# Patient Record
Sex: Female | Born: 1955 | Race: White | Hispanic: No
Health system: Southern US, Community
[De-identification: ages and names within clinical notes are randomized; demographics above are authoritative.]

## PROBLEM LIST (undated history)

## (undated) DIAGNOSIS — E119 Type 2 diabetes mellitus without complications: Secondary | ICD-10-CM

## (undated) DIAGNOSIS — F419 Anxiety disorder, unspecified: Secondary | ICD-10-CM

## (undated) DIAGNOSIS — Z720 Tobacco use: Secondary | ICD-10-CM

## (undated) DIAGNOSIS — I10 Essential (primary) hypertension: Secondary | ICD-10-CM

## (undated) DIAGNOSIS — E785 Hyperlipidemia, unspecified: Secondary | ICD-10-CM

## (undated) HISTORY — PX: OTHER SURGICAL HISTORY: SHX169

---

## 2007-02-08 ENCOUNTER — Emergency Department: Payer: Self-pay | Admitting: Emergency Medicine

## 2009-10-21 ENCOUNTER — Emergency Department: Payer: Self-pay | Admitting: Emergency Medicine

## 2009-12-25 ENCOUNTER — Inpatient Hospital Stay: Payer: Self-pay | Admitting: Internal Medicine

## 2010-02-13 ENCOUNTER — Emergency Department: Payer: Self-pay | Admitting: Emergency Medicine

## 2010-02-15 ENCOUNTER — Emergency Department: Payer: Self-pay | Admitting: Emergency Medicine

## 2019-06-11 ENCOUNTER — Emergency Department: Payer: Medicaid Other

## 2019-06-11 ENCOUNTER — Other Ambulatory Visit: Payer: Self-pay

## 2019-06-11 ENCOUNTER — Observation Stay
Admission: EM | Admit: 2019-06-11 | Discharge: 2019-06-13 | Disposition: A | Discharge status: Home / Self Care | Payer: Medicaid Other | Attending: Internal Medicine | Admitting: Internal Medicine

## 2019-06-11 ENCOUNTER — Encounter: Payer: Self-pay | Admitting: Emergency Medicine

## 2019-06-11 DIAGNOSIS — I2510 Atherosclerotic heart disease of native coronary artery without angina pectoris: Secondary | ICD-10-CM | POA: Diagnosis not present

## 2019-06-11 DIAGNOSIS — M1712 Unilateral primary osteoarthritis, left knee: Secondary | ICD-10-CM | POA: Diagnosis not present

## 2019-06-11 DIAGNOSIS — Z7902 Long term (current) use of antithrombotics/antiplatelets: Secondary | ICD-10-CM | POA: Insufficient documentation

## 2019-06-11 DIAGNOSIS — E1165 Type 2 diabetes mellitus with hyperglycemia: Secondary | ICD-10-CM | POA: Diagnosis not present

## 2019-06-11 DIAGNOSIS — Z79899 Other long term (current) drug therapy: Secondary | ICD-10-CM | POA: Insufficient documentation

## 2019-06-11 DIAGNOSIS — F1320 Sedative, hypnotic or anxiolytic dependence, uncomplicated: Secondary | ICD-10-CM | POA: Insufficient documentation

## 2019-06-11 DIAGNOSIS — E785 Hyperlipidemia, unspecified: Secondary | ICD-10-CM | POA: Diagnosis not present

## 2019-06-11 DIAGNOSIS — E559 Vitamin D deficiency, unspecified: Secondary | ICD-10-CM | POA: Diagnosis not present

## 2019-06-11 DIAGNOSIS — Z888 Allergy status to other drugs, medicaments and biological substances status: Secondary | ICD-10-CM | POA: Insufficient documentation

## 2019-06-11 DIAGNOSIS — F17210 Nicotine dependence, cigarettes, uncomplicated: Secondary | ICD-10-CM | POA: Insufficient documentation

## 2019-06-11 DIAGNOSIS — I10 Essential (primary) hypertension: Secondary | ICD-10-CM | POA: Insufficient documentation

## 2019-06-11 DIAGNOSIS — Z880 Allergy status to penicillin: Secondary | ICD-10-CM | POA: Diagnosis not present

## 2019-06-11 DIAGNOSIS — D649 Anemia, unspecified: Secondary | ICD-10-CM | POA: Diagnosis not present

## 2019-06-11 DIAGNOSIS — R262 Difficulty in walking, not elsewhere classified: Secondary | ICD-10-CM | POA: Diagnosis present

## 2019-06-11 DIAGNOSIS — Z8249 Family history of ischemic heart disease and other diseases of the circulatory system: Secondary | ICD-10-CM | POA: Insufficient documentation

## 2019-06-11 DIAGNOSIS — W19XXXA Unspecified fall, initial encounter: Secondary | ICD-10-CM | POA: Diagnosis not present

## 2019-06-11 DIAGNOSIS — Z794 Long term (current) use of insulin: Secondary | ICD-10-CM | POA: Diagnosis not present

## 2019-06-11 DIAGNOSIS — R531 Weakness: Secondary | ICD-10-CM | POA: Diagnosis present

## 2019-06-11 DIAGNOSIS — D509 Iron deficiency anemia, unspecified: Secondary | ICD-10-CM | POA: Diagnosis not present

## 2019-06-11 DIAGNOSIS — F419 Anxiety disorder, unspecified: Secondary | ICD-10-CM | POA: Diagnosis not present

## 2019-06-11 DIAGNOSIS — Z20822 Contact with and (suspected) exposure to covid-19: Secondary | ICD-10-CM | POA: Diagnosis not present

## 2019-06-11 DIAGNOSIS — N179 Acute kidney failure, unspecified: Secondary | ICD-10-CM | POA: Insufficient documentation

## 2019-06-11 HISTORY — DX: Tobacco use: Z72.0

## 2019-06-11 HISTORY — DX: Anxiety disorder, unspecified: F41.9

## 2019-06-11 HISTORY — DX: Hyperlipidemia, unspecified: E78.5

## 2019-06-11 HISTORY — DX: Essential (primary) hypertension: I10

## 2019-06-11 HISTORY — DX: Type 2 diabetes mellitus without complications: E11.9

## 2019-06-11 NOTE — ED Triage Notes (Signed)
Pt presents to ED via [facility] from home c/o feeling like her L knee if giving out on her starting today. Pt denies any pain or injury but states every time she tries to put weight on L leg the knee just gives out. Has chronic R knee pain, states she is supposed to have knee replacement on R side but has been delayed d/t pandemic.

## 2019-06-12 ENCOUNTER — Encounter: Payer: Self-pay | Admitting: Family Medicine

## 2019-06-12 DIAGNOSIS — F419 Anxiety disorder, unspecified: Secondary | ICD-10-CM

## 2019-06-12 DIAGNOSIS — E1165 Type 2 diabetes mellitus with hyperglycemia: Secondary | ICD-10-CM | POA: Diagnosis not present

## 2019-06-12 DIAGNOSIS — I10 Essential (primary) hypertension: Secondary | ICD-10-CM

## 2019-06-12 DIAGNOSIS — D649 Anemia, unspecified: Secondary | ICD-10-CM | POA: Diagnosis not present

## 2019-06-12 DIAGNOSIS — R262 Difficulty in walking, not elsewhere classified: Secondary | ICD-10-CM | POA: Diagnosis not present

## 2019-06-12 LAB — BASIC METABOLIC PANEL
Anion gap: 12 (ref 5–15)
Anion gap: 10 (ref 5–15)
BUN: 17 mg/dL (ref 8–23)
BUN: 15 mg/dL (ref 8–23)
CO2: 26 mmol/L (ref 22–32)
CO2: 28 mmol/L (ref 22–32)
Calcium: 8.9 mg/dL (ref 8.9–10.3)
Calcium: 8.8 mg/dL — ABNORMAL LOW (ref 8.9–10.3)
Chloride: 88 mmol/L — ABNORMAL LOW (ref 98–111)
Chloride: 93 mmol/L — ABNORMAL LOW (ref 98–111)
Creatinine, Ser: 1.13 mg/dL — ABNORMAL HIGH (ref 0.44–1.00)
Creatinine, Ser: 0.83 mg/dL (ref 0.44–1.00)
GFR calc Af Amer: >60 mL/min (ref >60)
GFR calc Af Amer: >60 mL/min (ref >60)
GFR calc non Af Amer: 52 mL/min — ABNORMAL LOW (ref >60)
GFR calc non Af Amer: >60 mL/min (ref >60)
Glucose, Bld: 579 mg/dL (ref 70–99)
Glucose, Bld: 317 mg/dL — ABNORMAL HIGH (ref 70–99)
Potassium: 5.1 mmol/L (ref 3.5–5.1)
Potassium: 4 mmol/L (ref 3.5–5.1)
Sodium: 126 mmol/L — ABNORMAL LOW (ref 135–145)
Sodium: 131 mmol/L — ABNORMAL LOW (ref 135–145)

## 2019-06-12 LAB — IRON AND TIBC
Iron: 12 ug/dL — ABNORMAL LOW (ref 28–170)
Saturation Ratios: 3 % — ABNORMAL LOW (ref 10.4–31.8)
TIBC: 434 ug/dL (ref 250–450)
UIBC: 422 ug/dL

## 2019-06-12 LAB — URINALYSIS, COMPLETE (UACMP) WITH MICROSCOPIC
Bacteria, UA: NONE SEEN
Bilirubin Urine: NEGATIVE
Glucose, UA: >=500 mg/dL — AB
Hgb urine dipstick: NEGATIVE
Ketones, ur: NEGATIVE mg/dL
Leukocytes,Ua: NEGATIVE
Nitrite: NEGATIVE
Protein, ur: NEGATIVE mg/dL
Specific Gravity, Urine: 1.025 (ref 1.005–1.030)
pH: 5 (ref 5.0–8.0)

## 2019-06-12 LAB — CBC
HCT: 27.1 % — ABNORMAL LOW (ref 36.0–46.0)
Hemoglobin: 8 g/dL — ABNORMAL LOW (ref 12.0–15.0)
MCH: 22.9 pg — ABNORMAL LOW (ref 26.0–34.0)
MCHC: 29.5 g/dL — ABNORMAL LOW (ref 30.0–36.0)
MCV: 77.7 fL — ABNORMAL LOW (ref 80.0–100.0)
Platelets: 344 10*3/uL (ref 150–400)
RBC: 3.49 MIL/uL — ABNORMAL LOW (ref 3.87–5.11)
RDW: 17.2 % — ABNORMAL HIGH (ref 11.5–15.5)
WBC: 9.8 10*3/uL (ref 4.0–10.5)
nRBC: 0 % (ref 0.0–0.2)

## 2019-06-12 LAB — HIV ANTIBODY (ROUTINE TESTING W REFLEX): HIV Screen 4th Generation wRfx: NONREACTIVE

## 2019-06-12 LAB — CBC WITH DIFFERENTIAL/PLATELET
Abs Immature Granulocytes: 0.03 10*3/uL (ref 0.00–0.07)
Basophils Absolute: 0 10*3/uL (ref 0.0–0.1)
Basophils Relative: 0 %
Eosinophils Absolute: 0.1 10*3/uL (ref 0.0–0.5)
Eosinophils Relative: 1 %
HCT: 29 % — ABNORMAL LOW (ref 36.0–46.0)
Hemoglobin: 8.5 g/dL — ABNORMAL LOW (ref 12.0–15.0)
Immature Granulocytes: 0 %
Lymphocytes Relative: 37 %
Lymphs Abs: 3.4 10*3/uL (ref 0.7–4.0)
MCH: 22.9 pg — ABNORMAL LOW (ref 26.0–34.0)
MCHC: 29.3 g/dL — ABNORMAL LOW (ref 30.0–36.0)
MCV: 78.2 fL — ABNORMAL LOW (ref 80.0–100.0)
Monocytes Absolute: 0.7 10*3/uL (ref 0.1–1.0)
Monocytes Relative: 7 %
Neutro Abs: 5 10*3/uL (ref 1.7–7.7)
Neutrophils Relative %: 55 %
Platelets: 366 10*3/uL (ref 150–400)
RBC: 3.71 MIL/uL — ABNORMAL LOW (ref 3.87–5.11)
RDW: 17.2 % — ABNORMAL HIGH (ref 11.5–15.5)
WBC: 9.3 10*3/uL (ref 4.0–10.5)
nRBC: 0 % (ref 0.0–0.2)

## 2019-06-12 LAB — RESPIRATORY PANEL BY RT PCR (FLU A&B, COVID)
Influenza A by PCR: NEGATIVE
Influenza B by PCR: NEGATIVE
SARS Coronavirus 2 by RT PCR: NEGATIVE

## 2019-06-12 LAB — RETICULOCYTES
Immature Retic Fract: 38.2 % — ABNORMAL HIGH (ref 2.3–15.9)
RBC.: 3.36 MIL/uL — ABNORMAL LOW (ref 3.87–5.11)
Retic Count, Absolute: 72.9 10*3/uL (ref 19.0–186.0)
Retic Ct Pct: 2.2 % (ref 0.4–3.1)

## 2019-06-12 LAB — TYPE AND SCREEN
ABO/RH(D): A POS
Antibody Screen: NEGATIVE

## 2019-06-12 LAB — FOLATE: Folate: 18.3 ng/mL (ref >5.9)

## 2019-06-12 LAB — GLUCOSE, CAPILLARY
Glucose-Capillary: 211 mg/dL — ABNORMAL HIGH (ref 70–99)
Glucose-Capillary: 191 mg/dL — ABNORMAL HIGH (ref 70–99)
Glucose-Capillary: 250 mg/dL — ABNORMAL HIGH (ref 70–99)

## 2019-06-12 LAB — VITAMIN B12: Vitamin B-12: 293 pg/mL (ref 180–914)

## 2019-06-12 LAB — FERRITIN: Ferritin: 6 ng/mL — ABNORMAL LOW (ref 11–307)

## 2019-06-12 MED ORDER — TRAZODONE HCL 50 MG PO TABS: 25.0000 mg | ORAL_TABLET | Freq: Every evening | ORAL | Status: DC | PRN

## 2019-06-12 MED ORDER — GABAPENTIN 400 MG PO CAPS: 400.0000 mg | ORAL_CAPSULE | Freq: Three times a day (TID) | ORAL | Status: DC

## 2019-06-12 MED ORDER — OXYCODONE-ACETAMINOPHEN 5-325 MG PO TABS
1.0000 | ORAL_TABLET | Freq: Once | ORAL | Status: AC
  Administered 2019-06-12: 1 via ORAL
  Filled 2019-06-12: qty 1

## 2019-06-12 MED ORDER — ACETAMINOPHEN 325 MG PO TABS
650.0000 mg | ORAL_TABLET | Freq: Four times a day (QID) | ORAL | Status: DC | PRN
  Administered 2019-06-12 – 2019-06-13 (×4): 650 mg via ORAL
  Filled 2019-06-12 (×4): qty 2

## 2019-06-12 MED ORDER — INSULIN GLARGINE 100 UNIT/ML ~~LOC~~ SOLN
30.0000 [IU] | Freq: Every day | SUBCUTANEOUS | Status: DC
  Administered 2019-06-12: 30 [IU] via SUBCUTANEOUS
  Filled 2019-06-12 (×3): qty 0.3

## 2019-06-12 MED ORDER — INSULIN ASPART 100 UNIT/ML ~~LOC~~ SOLN: 0.0000 [IU] | Freq: Three times a day (TID) | SUBCUTANEOUS | Status: DC

## 2019-06-12 MED ORDER — SODIUM CHLORIDE 0.9 % IV SOLN: INTRAVENOUS | Status: DC

## 2019-06-12 MED ORDER — INSULIN ASPART 100 UNIT/ML ~~LOC~~ SOLN
4.0000 [IU] | Freq: Three times a day (TID) | SUBCUTANEOUS | Status: DC
  Administered 2019-06-12 – 2019-06-13 (×4): 4 [IU] via SUBCUTANEOUS
  Filled 2019-06-12 (×5): qty 1

## 2019-06-12 MED ORDER — INSULIN ASPART 100 UNIT/ML ~~LOC~~ SOLN
0.0000 [IU] | Freq: Every day | SUBCUTANEOUS | Status: DC
  Administered 2019-06-12: 2 [IU] via SUBCUTANEOUS
  Filled 2019-06-12: qty 1

## 2019-06-12 MED ORDER — ONDANSETRON HCL 4 MG/2ML IJ SOLN: 4.0000 mg | Freq: Four times a day (QID) | INTRAMUSCULAR | Status: DC | PRN

## 2019-06-12 MED ORDER — HYDRALAZINE HCL 50 MG PO TABS: 50.0000 mg | ORAL_TABLET | Freq: Four times a day (QID) | ORAL | Status: DC | PRN

## 2019-06-12 MED ORDER — INSULIN ASPART 100 UNIT/ML ~~LOC~~ SOLN
0.0000 [IU] | SUBCUTANEOUS | Status: DC
  Administered 2019-06-12: 15 [IU] via SUBCUTANEOUS
  Filled 2019-06-12: qty 1

## 2019-06-12 MED ORDER — NICOTINE 14 MG/24HR TD PT24
14.0000 mg | MEDICATED_PATCH | Freq: Every day | TRANSDERMAL | Status: DC
  Administered 2019-06-12: 14 mg via TRANSDERMAL
  Filled 2019-06-12: qty 1

## 2019-06-12 MED ORDER — INSULIN GLARGINE 100 UNIT/ML ~~LOC~~ SOLN
32.0000 [IU] | Freq: Once | SUBCUTANEOUS | Status: AC
  Administered 2019-06-12: 32 [IU] via SUBCUTANEOUS
  Filled 2019-06-12: qty 0.32

## 2019-06-12 MED ORDER — FE FUMARATE-B12-VIT C-FA-IFC PO CAPS
1.0000 | ORAL_CAPSULE | Freq: Two times a day (BID) | ORAL | Status: DC
  Administered 2019-06-13: 1 via ORAL
  Filled 2019-06-12 (×3): qty 1

## 2019-06-12 MED ORDER — GABAPENTIN 400 MG PO CAPS
1200.0000 mg | ORAL_CAPSULE | Freq: Three times a day (TID) | ORAL | Status: DC
  Administered 2019-06-12 – 2019-06-13 (×3): 1200 mg via ORAL
  Filled 2019-06-12 (×3): qty 3

## 2019-06-12 MED ORDER — ONDANSETRON HCL 4 MG PO TABS: 4.0000 mg | ORAL_TABLET | Freq: Four times a day (QID) | ORAL | Status: DC | PRN

## 2019-06-12 MED ORDER — ENOXAPARIN SODIUM 40 MG/0.4ML ~~LOC~~ SOLN
40.0000 mg | SUBCUTANEOUS | Status: DC
  Administered 2019-06-12 – 2019-06-13 (×2): 40 mg via SUBCUTANEOUS
  Filled 2019-06-12 (×2): qty 0.4

## 2019-06-12 MED ORDER — ACETAMINOPHEN 650 MG RE SUPP: 650.0000 mg | Freq: Four times a day (QID) | RECTAL | Status: DC | PRN

## 2019-06-12 MED ORDER — LACTATED RINGERS IV BOLUS
1000.0000 mL | Freq: Once | INTRAVENOUS | Status: AC
  Administered 2019-06-12: 1000 mL via INTRAVENOUS

## 2019-06-12 MED ORDER — SODIUM CHLORIDE 0.9 % IV SOLN
510.0000 mg | Freq: Once | INTRAVENOUS | Status: AC
  Administered 2019-06-12: 510 mg via INTRAVENOUS
  Filled 2019-06-12: qty 17

## 2019-06-12 MED ORDER — INSULIN ASPART 100 UNIT/ML ~~LOC~~ SOLN
10.0000 [IU] | Freq: Once | SUBCUTANEOUS | Status: AC
  Administered 2019-06-12: 10 [IU] via INTRAVENOUS
  Filled 2019-06-12: qty 1

## 2019-06-12 MED ORDER — INSULIN ASPART 100 UNIT/ML ~~LOC~~ SOLN
0.0000 [IU] | Freq: Three times a day (TID) | SUBCUTANEOUS | Status: DC
  Administered 2019-06-12: 4 [IU] via SUBCUTANEOUS
  Administered 2019-06-12: 7 [IU] via SUBCUTANEOUS
  Administered 2019-06-13 (×2): 11 [IU] via SUBCUTANEOUS
  Filled 2019-06-12 (×5): qty 1

## 2019-06-12 NOTE — ED Notes (Signed)
Date and time results received: 06/12/19  0139am  Test: Blood Glucose Critical Value: 579  Name of Provider Notified: Don Perking

## 2019-06-12 NOTE — Progress Notes (Addendum)
No charge progress note.   Leah Powell  is a 64 y.o. Caucasian female with a known history of type diabetes mellitus, hypertension, and anxiety, dyslipidemia and ongoing tobacco abuse, presented to emergency room with acute onset of fall.  She stated that she was lying in bed and get up to close the blinds and go to the bathroom when her legs gave way.  She stated that her knees went out and she fell sitting on her buttock.  No head injury.  She denied loss of balance, weakness or paresthesias.  No chest pain or dyspnea or palpitations.  She had right knee surgery in the past.  No urinary frequency or urgency or dysuria or hematuria or flank pain.  No nausea or vomiting.  Imaging negative for any acute injury or fractures.  Found to have hyperglycemia. Per patient she was not using her insulin regularly.  Recently increased dose causing some blurry vision so she decreased her dose. She was on narcotics for her knee pain, and her PCP is trying to decrease the dose due to falls and generalized weakness.  PT is recommending SNF placement. We will observe and reevaluate tomorrow. Diet her on Lantus 30 units at bedtime with resistant SSI and 4 units mealtime coverage. Patient was anemic-checked anemia panel with iron deficiency. -Give her one-time dose of IV iron. -Start her on iron supplement.

## 2019-06-12 NOTE — ED Provider Notes (Signed)
[facility] Emergency Department Provider Note  ____________________________________________  Time seen: Approximately 2:36 AM  I have reviewed the triage vital signs and the nursing notes.   HISTORY  Chief Complaint No chief complaint on file.   HPI Leah Powell is a 64 y.o. female with a history of diabetes, hyperlipidemia, CAD, chronic anxiety, chronic pain, benzodiazepine dependence, hypertension who presents from home for fall.  Patient reports that she was walking at home today when her legs gave out and she fell.  She was unable to stand up.  She feels very weak.  She feels that both her legs are weak but the left is worse than the right.  She has chronic bilateral knee problems which is usually worse on the right side.  She fell onto her buttock.  She could not get up which prompted her to call 911.  She denies hitting her head.  She is on Plavix for CAD.  She denies back pain.  She is complaining of weakness and pain of bilateral knees.  No headache, no slurred speech, no facial droop, no upper extremity weakness or numbness.  No fever or chills, no dysuria or hematuria, no abdominal pain, chest pain or shortness of breath.  PMH Diabetes Hypertension Hyperlipidemia CAD Anxiety Chronic pain  Allergies Budesonide-formoterol fumarate, Carvedilol, Penicillin g, and Sennosides-docusate sodium  FH COPD Mother     Social History Smoking - yes Alcohol - no Drugs - no   Review of Systems  Constitutional: Negative for fever. Eyes: Negative for visual changes. ENT: Negative for sore throat. Neck: No neck pain  Cardiovascular: Negative for chest pain. Respiratory: Negative for shortness of breath. Gastrointestinal: Negative for abdominal pain, vomiting or diarrhea. Genitourinary: Negative for dysuria. Musculoskeletal: Negative for back pain. + b/l knee pain, LLE weakness Skin: Negative for rash. Neurological: Negative for headaches,  weakness or numbness. Psych: No SI or HI  ____________________________________________   PHYSICAL EXAM:  VITAL SIGNS: ED Triage Vitals  Enc Vitals Group     BP 06/11/19 2301 (!) 124/57     Pulse Rate 06/11/19 2301 (!) 102     Resp 06/11/19 2301 18     Temp 06/11/19 2301 97.9 F (36.6 C)     Temp Source 06/11/19 2301 Oral     SpO2 06/11/19 2301 93 %     Weight 06/11/19 2303 173 lb (78.5 kg)     Height 06/11/19 2303 5\' 3"  (1.6 m)     Head Circumference --      Peak Flow --      Pain Score 06/11/19 2303 0     Pain Loc --      Pain Edu? --      Excl. in [place]? --     Constitutional: Alert and oriented. Well appearing and in no apparent distress. HEENT:      Head: Normocephalic and atraumatic.         Eyes: Conjunctivae are normal. Sclera is non-icteric.       Mouth/Throat: Mucous membranes are moist.       Neck: Supple with no signs of meningismus. Cardiovascular: Tachycardic with regular rhythm. Respiratory: Normal respiratory effort. Lungs are clear to auscultation bilaterally. No wheezes, crackles, or rhonchi.  Gastrointestinal: Soft, non tender, and non distended with positive bowel sounds. No rebound or guarding. Musculoskeletal: Nontender with normal range of motion in all extremities.  No CT no spine tenderness, full painless range of motion of bilateral hips and knees, bones are atraumatic.  No  edema, cyanosis, or erythema of extremities. Neurologic: Normal speech and language. Face is symmetric. Moving all extremities.  Normal strength of upper extremities and right lower extremity.  Patient is able to bend her leg on the left at the knee and hip but unable to lift her foot off the bed.  She reports that her leg feels heavy. Difficulty ambulating requiring assistance Skin: Skin is warm, dry and intact. No rash noted. Psychiatric: Mood and affect are normal. Speech and behavior are normal.  ____________________________________________   LABS (all labs ordered are listed,  but only abnormal results are displayed)  Labs Reviewed  CBC WITH DIFFERENTIAL/PLATELET - Abnormal; Notable for the following components:      Result Value   RBC 3.71 (*)    Hemoglobin 8.5 (*)    HCT 29.0 (*)    MCV 78.2 (*)    MCH 22.9 (*)    MCHC 29.3 (*)    RDW 17.2 (*)    All other components within normal limits  BASIC METABOLIC PANEL - Abnormal; Notable for the following components:   Sodium 126 (*)    Chloride 88 (*)    Glucose, Bld 579 (*)    Creatinine, Ser 1.13 (*)    GFR calc non Af Amer 52 (*)    All other components within normal limits  URINALYSIS, COMPLETE (UACMP) WITH MICROSCOPIC - Abnormal; Notable for the following components:   Color, Urine STRAW (*)    APPearance CLEAR (*)    Glucose, UA >=500 (*)    All other components within normal limits  TYPE AND SCREEN   ____________________________________________  EKG  none  ____________________________________________  RADIOLOGY  I have personally reviewed the images performed during this visit and I agree with the Radiologist's read.   Interpretation by Radiologist:  DG Lumbar Spine Complete  Result Date: 06/11/2019 CLINICAL DATA:  Left leg weakness, pain EXAM: LUMBAR SPINE - COMPLETE 4+ VIEW COMPARISON:  None FINDINGS: Five lumbar type vertebral bodies. Normal lumbar lordosis. No evidence of fracture or dislocation. Vertebral body heights and intervertebral disc spaces are maintained. Visualized bony pelvis appears intact. Vascular calcifications. IMPRESSION: Negative. Electronically Signed   By: Charline Bills M.D.   On: 06/11/2019 23:49   DG Knee Complete 4 Views Left  Result Date: 06/11/2019 CLINICAL DATA:  Left leg weakness EXAM: LEFT KNEE - COMPLETE 4+ VIEW COMPARISON:  None. FINDINGS: No fracture or dislocation is seen. Mild degenerative changes with lateral compartment osteophytosis. The visualized soft tissues are unremarkable. No suprapatellar knee joint effusion. IMPRESSION: Mild degenerative  changes of the lateral compartment. Electronically Signed   By: Charline Bills M.D.   On: 06/11/2019 23:48   DG Hip Unilat W or Wo Pelvis 2-3 Views Left  Result Date: 06/11/2019 CLINICAL DATA:  Left leg weakness, pain EXAM: DG HIP (WITH OR WITHOUT PELVIS) 2-3V LEFT COMPARISON:  None. FINDINGS: No fracture or dislocation is seen. The joint spaces are preserved. Visualized bony pelvis appears intact. IMPRESSION: Negative. Electronically Signed   By: Charline Bills M.D.   On: 06/11/2019 23:49     ____________________________________________   PROCEDURES  Procedure(s) performed:yes .1-3 Lead EKG Interpretation Performed by: Nita Sickle, MD Authorized by: Nita Sickle, MD     Interpretation: normal     ECG rate assessment: normal     Rhythm: sinus rhythm     Ectopy: none     Conduction: normal     Critical Care performed:  None ____________________________________________   INITIAL IMPRESSION / ASSESSMENT AND PLAN /  ED COURSE  64 y.o. female with a history of diabetes, hyperlipidemia, CAD, chronic anxiety, chronic pain, benzodiazepine dependence, hypertension who presents from home for fall after her left lower extremity gave out.  Patient is having weakness of that leg but other than that she is neurologically intact.  After couple of hours in the emergency room, she is now able to lift both of her legs but very weak.  Patient having difficulty ambulating even with assistance as she is very weak.  Nonfocal exam.  No CT and L-spine tenderness.  X-rays of the lumbar spine knee and hip were visualized and interpreted by me as negative which was confirmed by radiology.  Patient denies head trauma.  Labs consistent with hyperglycemia with glucose of 579 but no evidence of DKA with a normal anion gap and bicarb.  Patient also has acute kidney injury and worsening anemia.  She denies melena, hematochezia, hematemesis, or hematuria.  She is on Plavix but her rectal exam here is  negative.  Since patient is unable to ambulate and lives alone she would need to be admitted to the hospitalist.  Patient has received IV fluids, IV short acting insulin, her evening dose of IM Lantus.  Type and screen active.  Patient monitor on telemetry.  Old medical records have been reviewed.  Patient has been discussed with the hospitalist service for admission.     _____________________________________________ Please note:  Patient was evaluated in Emergency Department today for the symptoms described in the history of present illness. Patient was evaluated in the context of the global COVID-19 pandemic, which necessitated consideration that the patient might be at risk for infection with the SARS-CoV-2 virus that causes COVID-19. Institutional protocols and algorithms that pertain to the evaluation of patients at risk for COVID-19 are in a state of rapid change based on information released by regulatory bodies including the CDC and federal and state organizations. These policies and algorithms were followed during the patient's care in the ED.  Some ED evaluations and interventions may be delayed as a result of limited staffing during the pandemic.   [place] Controlled Substance Database was reviewed by me. ____________________________________________   FINAL CLINICAL IMPRESSION(S) / ED DIAGNOSES   Final diagnoses:  Type 2 diabetes mellitus with hyperglycemia, with long-term current use of insulin (HCC)  Generalized weakness  Anemia, unspecified type  AKI (acute kidney injury) (HCC)      NEW MEDICATIONS STARTED DURING THIS VISIT:  ED Discharge Orders    None       Note:  This document was prepared using Dragon voice recognition software and may include unintentional dictation errors.    Don Perking, [place], MD 06/12/19 [phone]

## 2019-06-12 NOTE — Plan of Care (Signed)
  Problem: Health Behavior/Discharge Planning: Goal: Ability to manage health-related needs will improve Outcome: Progressing   Problem: Clinical Measurements: Goal: Ability to maintain clinical measurements within normal limits will improve Outcome: Progressing Goal: Will remain free from infection Outcome: Progressing   Problem: Activity: Goal: Risk for activity intolerance will decrease Outcome: Progressing   Problem: Nutrition: Goal: Adequate nutrition will be maintained Outcome: Progressing   Problem: Coping: Goal: Level of anxiety will decrease Outcome: Progressing   Problem: Elimination: Goal: Will not experience complications related to bowel motility Outcome: Progressing Goal: Will not experience complications related to urinary retention Outcome: Progressing   Problem: Pain Managment: Goal: General experience of comfort will improve Outcome: Progressing   Problem: Safety: Goal: Ability to remain free from injury will improve Outcome: Progressing   Problem: Skin Integrity: Goal: Risk for impaired skin integrity will decrease Outcome: Progressing   

## 2019-06-12 NOTE — ED Notes (Signed)
With pt's permission, son updated on pt status.  Son requesting to talk with MD to get more information.  Will relay this to attending.

## 2019-06-12 NOTE — H&P (Addendum)
[place] at [facility]   PATIENT NAME: Leah Powell    MR#:  [id number]  DATE OF BIRTH:  1955-12-13  DATE OF ADMISSION:  06/11/2019  PRIMARY CARE PHYSICIAN: System, Pcp Not In   REQUESTING/REFERRING PHYSICIAN: Cecil Cobbs, MD CHIEF COMPLAINT:  Fall, could not walk  HISTORY OF PRESENT ILLNESS:  Leah Powell  is a 64 y.o. Caucasian female with a known history of type diabetes mellitus, hypertension, and anxiety, dyslipidemia and ongoing tobacco abuse, presented to emergency room with acute onset of fall.  She stated that she was lying in bed and get up to close the blinds and go to the bathroom when her legs gave way.  She stated that her knees went out and she fell sitting on her buttock.  No head injury.  She denied loss of balance, weakness or paresthesias.  No chest pain or dyspnea or palpitations.  She had right knee surgery in the past.  No urinary frequency or urgency or dysuria or hematuria or flank pain.  No nausea or vomiting.  Upon presentation to the emergency room, heart rate was 105 with otherwise normal vital signs.  Labs revealed hyponatremia and hypochloremia, potassium of 5.1, blood glucose of 579 with a BUN of 17 creatinine 1.13 and CBC showing anemia with hemoglobin 8.5 hematocrit 29 with no previous levels for comparison and low RBC indices.  UA had more than 500 glucose.  Hip x-ray revealed no acute abnormalities.  Knee x-ray revealed mild degenerative changes of the lateral compartment.  LS-spine showed no acute abnormalities.  The patient was given 1 L bolus of IV lactated Ringer, 10 units of IV NovoLog and 32 units of subcutaneous Lantus as well as 1 p.o. Percocet.  She will be admitted to an observation medical monitored bed for further evaluation and management.  PAST MEDICAL HISTORY:   Past Medical History:  Diagnosis Date  . Anxiety   . Dyslipidemia   . Hypertension   . Tobacco abuse   . Type II diabetes mellitus (HCC)   -Vitamin D  deficiency -Coronary artery disease -History of peripheral neuropathy  PAST SURGICAL HISTORY:  Right knee surgery C-section  SOCIAL HISTORY:   Social History   Tobacco Use  . Smoking status: Current Every Day Smoker    Packs/day: 0.50    Types: Cigarettes  Substance Use Topics  . Alcohol use: Not Currently    FAMILY HISTORY:   Family History  Problem Relation Age of Onset  . Coronary artery disease Mother        Status post MI    DRUG ALLERGIES:   Allergies  Allergen Reactions  . Budesonide-Formoterol Fumarate Swelling  . Carvedilol Swelling  . Penicillin G Hives  . Sennosides-Docusate Sodium Rash    REVIEW OF SYSTEMS:   ROS As per history of present illness. All pertinent systems were reviewed above. Constitutional,  HEENT, cardiovascular, respiratory, GI, GU, musculoskeletal, neuro, psychiatric, endocrine,  integumentary and hematologic systems were reviewed and are otherwise  negative/unremarkable except for positive findings mentioned above in the HPI.   MEDICATIONS AT HOME:   Prior to Admission medications   Not on File      VITAL SIGNS:  Blood pressure 107/75, pulse 89, temperature 97.9 F (36.6 C), temperature source Oral, resp. rate 18, height 5\' 3"  (1.6 m), weight 78.5 kg, SpO2 97 %.  PHYSICAL EXAMINATION:  Physical Exam  GENERAL:  64 y.o.-year-old Caucasian female patient lying in the bed with no acute distress.  EYES: Pupils  equal, round, reactive to light and accommodation. No scleral icterus. Extraocular muscles intact.  HEENT: Head atraumatic, normocephalic. Oropharynx and nasopharynx clear.  NECK:  Supple, no jugular venous distention. No thyroid enlargement, no tenderness.  LUNGS: Normal breath sounds bilaterally, no wheezing, rales,rhonchi or crepitation. No use of accessory muscles of respiration.  CARDIOVASCULAR: Regular rate and rhythm, S1, S2 normal. No murmurs, rubs, or gallops.  ABDOMEN: Soft, nondistended, nontender. Bowel  sounds present. No organomegaly or mass.  EXTREMITIES: No pedal edema, cyanosis, or clubbing. Musculoskeletal: Mild pain on range of motion of both knees. NEUROLOGIC: Cranial nerves II through XII are intact. Muscle strength 5/5 in all extremities. Sensation intact. Gait not checked.  PSYCHIATRIC: The patient is alert and oriented x 3.  Normal affect and good eye contact. SKIN: No obvious rash, lesion, or ulcer.   LABORATORY PANEL:   CBC Recent Labs  Lab 06/12/19 0113  WBC 9.3  HGB 8.5*  HCT 29.0*  PLT 366   ------------------------------------------------------------------------------------------------------------------  Chemistries  Recent Labs  Lab 06/12/19 0113  NA 126*  K 5.1  CL 88*  CO2 26  GLUCOSE 579*  BUN 17  CREATININE 1.13*  CALCIUM 8.9   ------------------------------------------------------------------------------------------------------------------  Cardiac Enzymes No results for input(s): TROPONINI in the last 168 hours. ------------------------------------------------------------------------------------------------------------------  RADIOLOGY:  DG Lumbar Spine Complete  Result Date: 06/11/2019 CLINICAL DATA:  Left leg weakness, pain EXAM: LUMBAR SPINE - COMPLETE 4+ VIEW COMPARISON:  None FINDINGS: Five lumbar type vertebral bodies. Normal lumbar lordosis. No evidence of fracture or dislocation. Vertebral body heights and intervertebral disc spaces are maintained. Visualized bony pelvis appears intact. Vascular calcifications. IMPRESSION: Negative. Electronically Signed   By: Julian Hy M.D.   On: 06/11/2019 23:49   DG Knee Complete 4 Views Left  Result Date: 06/11/2019 CLINICAL DATA:  Left leg weakness EXAM: LEFT KNEE - COMPLETE 4+ VIEW COMPARISON:  None. FINDINGS: No fracture or dislocation is seen. Mild degenerative changes with lateral compartment osteophytosis. The visualized soft tissues are unremarkable. No suprapatellar knee joint  effusion. IMPRESSION: Mild degenerative changes of the lateral compartment. Electronically Signed   By: Julian Hy M.D.   On: 06/11/2019 23:48   DG Hip Unilat W or Wo Pelvis 2-3 Views Left  Result Date: 06/11/2019 CLINICAL DATA:  Left leg weakness, pain EXAM: DG HIP (WITH OR WITHOUT PELVIS) 2-3V LEFT COMPARISON:  None. FINDINGS: No fracture or dislocation is seen. The joint spaces are preserved. Visualized bony pelvis appears intact. IMPRESSION: Negative. Electronically Signed   By: Julian Hy M.D.   On: 06/11/2019 23:49      IMPRESSION AND PLAN:  1.  Fall with subsequent inability to ambulate. -The patient will be admitted to an observation medical monitored bed. -Pain management will be provided -Physical therapy consult to be obtained. -We will obtain an orthopedic evaluation given inability to ambulate. -I notified Dr. Mack Guise about the patient.  2.  Uncontrolled type 2 diabetes mellitus with nonketotic hyperglycemia. -The patient will be placed on intense protocol with subcutaneous NovoLog with frequent fingerstick blood glucose measures. -We will continue her basal coverage with Lantus. -We will check her hemoglobin A1c. -Continue hydration with IV normal saline.  3.  Anemia. -We have no previous records to show chronicity. -We will obtain anemia work-up for the possibility of symptomatic anemia of new onset or acute on chronic anemia.  4.  Anxiety. -She is on BuSpar and as needed Valium.  She apparently takes them in the morning and her fall happened at night. -We  will hold them off for now pending physical therapy and orthopedic evaluation.  5.  Hypertension. -We will continue her antihypertensives as appropriate when available from her pharmacy. -For now, she will be placed on as needed hydralazine.  6.  Dyslipidemia. -We will continue her antihyperlipidemic therapy when available.  7.  DVT prophylaxis. -Subcutaneous Lovenox.   All the records are  reviewed and case discussed with ED provider. The plan of care was discussed in details with the patient (and family). I answered all questions. The patient agreed to proceed with the above mentioned plan. Further management will depend upon hospital course.   CODE STATUS: Full code  Status is: Observation  The patient remains OBS appropriate and will d/c before 2 midnights.  Dispo: The patient is from: Home              Anticipated d/c is to: Home              Anticipated d/c date is: 1 day              Patient currently is not medically stable to d/c.   TOTAL TIME TAKING CARE OF THIS PATIENT: 55 minutes.    Hannah Beat M.D on 06/12/2019 at 4:54 AM  [facility]   From 7 PM-7 AM, contact night-coverage [email or web address]  CC: Primary care physician; System, Pcp Not In   Note: This dictation was prepared with Dragon dictation along with smaller phrase technology. Any transcriptional errors that result from this process are unintentional.

## 2019-06-12 NOTE — Consult Note (Signed)
ORTHOPAEDIC CONSULTATION  REQUESTING PHYSICIAN: Arnetha Courser, MD  Chief Complaint: Difficulty weightbearing on left lower extremity status post fall  HPI: Leah Powell is a 64 y.o. female who was admitted from the emergency department overnight.  Patient explains that she slid off her bed while trying to close the blinds of her room.  Patient denies previous issues with her left lower extremity.  She states she is due to have a right total knee replacement at [facility] in the next couple months.  Patient fell onto her buttock was unable to get up.  Patient called 911 was brought to the hospital for evaluation.  Today the patient states her left knee is feeling much better.  She denies any significant pain in the left lower extremity.  She denies any numbness or tingling in the left lower extremity..  Past Medical History:  Diagnosis Date  . Anxiety   . Dyslipidemia   . Hypertension   . Tobacco abuse   . Type II diabetes mellitus (HCC)    Past Surgical History:  Procedure Laterality Date  . CESAREAN SECTION    . Right knee surgery     Social History   Socioeconomic History  . Marital status: Single    Spouse name: Not on file  . Number of children: Not on file  . Years of education: Not on file  . Highest education level: Not on file  Occupational History  . Not on file  Tobacco Use  . Smoking status: Current Every Day Smoker    Packs/day: 0.50    Types: Cigarettes  . Smokeless tobacco: Never Used  Substance and Sexual Activity  . Alcohol use: Not Currently  . Drug use: Never  . Sexual activity: Not on file  Other Topics Concern  . Not on file  Social History Narrative  . Not on file   Social Determinants of Health   Financial Resource Strain:   . Difficulty of Paying Living Expenses:   Food Insecurity:   . Worried About Programme researcher, broadcasting/film/video in the Last Year:   . Barista in the Last Year:   Transportation Needs:   . Freight forwarder (Medical):   Marland Kitchen  Lack of Transportation (Non-Medical):   Physical Activity:   . Days of Exercise per Week:   . Minutes of Exercise per Session:   Stress:   . Feeling of Stress :   Social Connections:   . Frequency of Communication with Friends and Family:   . Frequency of Social Gatherings with Friends and Family:   . Attends Religious Services:   . Active Member of Clubs or Organizations:   . Attends Banker Meetings:   Marland Kitchen Marital Status:    Family History  Problem Relation Age of Onset  . Coronary artery disease Mother        Status post MI   Allergies  Allergen Reactions  . Budesonide-Formoterol Fumarate Swelling  . Carvedilol Swelling  . Penicillin G Hives  . Sennosides-Docusate Sodium Rash   Prior to Admission medications   Medication Sig Start Date End Date Taking? Authorizing Provider  acetaminophen (TYLENOL) 500 MG tablet Take by mouth. Take 2 tablets (1,000 mg total) by mouth every six (6) hours as needed for pain. 04/17/19 04/16/20 Yes [provider]  atorvastatin (LIPITOR) 40 MG tablet Take by mouth. Take 1 tablet (40 mg total) by mouth daily. For cholesterol 02/11/19 02/11/20 Yes [provider]  bisacodyl (DULCOLAX) 10 MG suppository Place 10  mg rectally as needed. 10/28/18  Yes [provider]  busPIRone (BUSPAR) 10 MG tablet Take by mouth. Take 1 tablet (10 mg total) by mouth Four (4) times a day. 03/25/19 03/24/20 Yes [provider]  Cholecalciferol 25 MCG (1000 UT) tablet Take 1,000 Units by mouth daily. 05/02/19 08/18/19 Yes [provider]  clonazePAM (KLONOPIN) 0.5 MG tablet Take 0.25 mg by mouth in the morning. 03/31/17  Yes [provider]  clopidogrel (PLAVIX) 75 MG tablet Take 75 mg by mouth daily. 03/01/19  Yes [provider]  diazepam (VALIUM) 5 MG tablet Take by mouth. Take ONE AND ONE-HALF tablets (7.5 mg total) by mouth every morning AND ONE HALF tablet (2.5 mg total) nightly. 04/29/19 07/03/19 Yes  [provider]  esomeprazole (NEXIUM) 40 MG capsule Take 40 mg by mouth daily. 03/17/19 03/16/20 Yes [provider]  ezetimibe (ZETIA) 10 MG tablet Take 10 mg by mouth daily. 05/03/19 05/02/20 Yes [provider]  gabapentin (NEURONTIN) 400 MG capsule Take by mouth. Take 3 capsules (1,200 mg total) by mouth Three (3) times a day. 05/25/19 05/24/20 Yes [provider]  insulin glargine (LANTUS SOLOSTAR) 100 UNIT/ML Solostar Pen Inject 32 Units into the skin at bedtime. Inject 0.32 mL (32 units total) under the skin nightly 03/31/17  Yes [provider]  lidocaine (XYLOCAINE) 5 % ointment Apply to back of left arm twice a day as needed 10/22/18  Yes [provider]  liraglutide (VICTOZA) 18 MG/3ML SOPN Inject into the skin. Inject 0.3 mL (1.8 mg total) under the skin daily. 03/29/18  Yes [provider]  melatonin 3 MG TABS tablet Take by mouth. Take 3 tablets (9 mg total) by mouth nightly. 03/29/18  Yes [provider]  nicotine (NICODERM CQ - DOSED IN MG/24 HOURS) 21 mg/24hr patch Place onto the skin. Place 1 patch on the skin daily. Place patch on hairless skin on upper body, including arms and back. Each day: discard old patch, shower, apply new patch to different site. 02/17/19  Yes [provider]  nicotine polacrilex (COMMIT) 2 MG lozenge Use 1 lozenge every 1-2 hours as needed instead of a cigarette 04/29/19  Yes [provider]  nitroGLYCERIN (NITROSTAT) 0.4 MG SL tablet Place under the tongue. Place 1 tablet (0.4 mg total) under the tongue every five (5) minutes as needed for chest pain. Maximum of 3 doses in 15 minutes. 10/20/18 10/20/19 Yes [provider]  oxyCODONE (OXY IR/ROXICODONE) 5 MG immediate release tablet Take by mouth.  Take 1 tablet (5 mg total) by mouth every eight (8) hours as needed for pain. 05/19/19 06/18/19 Yes [provider]  valsartan (DIOVAN) 80 MG tablet Take by mouth. Take 1  tablet (80 mg total) by mouth daily. 09/14/18 09/14/19 Yes [provider]  metFORMIN (GLUCOPHAGE) 1000 MG tablet Take by mouth. Take 1,000 mg by mouth 2 (two) times daily with meals    [provider]   DG Lumbar Spine Complete  Result Date: 06/11/2019 CLINICAL DATA:  Left leg weakness, pain EXAM: LUMBAR SPINE - COMPLETE 4+ VIEW COMPARISON:  None FINDINGS: Five lumbar type vertebral bodies. Normal lumbar lordosis. No evidence of fracture or dislocation. Vertebral body heights and intervertebral disc spaces are maintained. Visualized bony pelvis appears intact. Vascular calcifications. IMPRESSION: Negative. Electronically Signed   By: Charline Bills M.D.   On: 06/11/2019 23:49   DG Knee Complete 4 Views Left  Result Date: 06/11/2019 CLINICAL DATA:  Left leg weakness EXAM:  LEFT KNEE - COMPLETE 4+ VIEW COMPARISON:  None. FINDINGS: No fracture or dislocation is seen. Mild degenerative changes with lateral compartment osteophytosis. The visualized soft tissues are unremarkable. No suprapatellar knee joint effusion. IMPRESSION: Mild degenerative changes of the lateral compartment. Electronically Signed   By: Julian Hy M.D.   On: 06/11/2019 23:48   DG Hip Unilat W or Wo Pelvis 2-3 Views Left  Result Date: 06/11/2019 CLINICAL DATA:  Left leg weakness, pain EXAM: DG HIP (WITH OR WITHOUT PELVIS) 2-3V LEFT COMPARISON:  None. FINDINGS: No fracture or dislocation is seen. The joint spaces are preserved. Visualized bony pelvis appears intact. IMPRESSION: Negative. Electronically Signed   By: Julian Hy M.D.   On: 06/11/2019 23:49    Positive ROS: All other systems have been reviewed and were otherwise negative with the exception of those mentioned in the HPI and as above.  Physical Exam: General: Alert, no acute distress  MUSCULOSKELETAL: Left lower extremity: Patient was seen laying in her hospital bed.  Her left knee was flexed 90 degrees when I entered the room.  Patient  is able to actively extend her knee fully.  She can perform a straight leg raise without lag.  She can flex the knee to proximal 120 degrees without pain.  As she had no pain to palpation around the knee.  Her skin is intact.  There is no erythema ecchymosis or effusion.  She had no calf tenderness or lower leg edema.  She palpable pedal pulses, intact/light touch throughout the left lower extremity and intact motor function without detectable weakness.  She had no pain with internal or external rotation of the left hip.  Assessment: Left lower extremity weakness  Plan: Patient states she is feeling much better today.  She had no focal neurologic deficits on her exam today.  Patient had no significant pain with movement or of her left hip knee or ankle.  There is no signs of lower extremity trauma.  Patient states she has been in the process of moving and may have a bulging disc which may have caused some lower extremity weakness.  As she does not demonstrate this weakness this evening.  I am recommending physical therapy evaluation.  Patient does not require any surgical intervention at this time.  There is no evidence of musculoskeletal injuries and I do not feel that advanced imaging is warranted.  If the patient does well with physical therapy tomorrow she may be discharged home and may follow-up in our office on an as-needed basis.Marland Kitchen    Thornton [place], MD    06/12/2019 6:20 PM

## 2019-06-12 NOTE — Progress Notes (Signed)
Physical Therapy Evaluation Patient Details Name: Leah Powell MRN: [id number] DOB: 05/05/1955 Today's Date: 06/12/2019   History of Present Illness  Per MD note:Leah Powell  is a 64 y.o. Caucasian female with a known history of type diabetes mellitus, hypertension, and anxiety, dyslipidemia and ongoing tobacco abuse, presented to emergency room with acute onset of fall.  She stated that she was lying in bed and get up to close the blinds and go to the bathroom when her legs gave way.  She stated that her knees went out and she fell sitting on her buttock.  No head injury.  She denied loss of balance, weakness or paresthesias.  No chest pain or dyspnea or palpitations.  She had right knee surgery in the past.  No urinary frequency or urgency or dysuria or hematuria or flank pain.  No nausea or vomiting.  Clinical Impression  Patient agrees to PT eval. She reports 10/10 to left foot, but is able to move it without increased pain. She has -3/5 hip strength bilaterally and 3/5 knee extension strength bilaterally. She needs mod assist for Supine <> sit bed mobility. She is able to sit with BUE assist and has posterior lean. She is feeling weak and shaking and reports that she has not had anything to eat or drink since last evening at 7:00 pm. Her nurse is able to bring her a breakfast tray and she is set up on stretcher to be able to eat. She is fatigued after sitting up on the edge of the stretcher for 5 mins and is overall weak and trembling. She will benefit from skilled PT to improve mobility and strength.     Follow Up Recommendations SNF    Equipment Recommendations  Rolling walker with 5" wheels    Recommendations for Other Services       Precautions / Restrictions Precautions Precautions: Fall Restrictions Weight Bearing Restrictions: No      Mobility  Bed Mobility Overal bed mobility: Needs Assistance Bed Mobility: Supine to Sit;Sit to Supine     Supine to sit: Mod  assist Sit to supine: Mod assist   General bed mobility comments: needs vc for safety and sequencing  Transfers Overall transfer level: (Patient is shaking due to weakness and thirst)                  Ambulation/Gait Ambulation/Gait assistance: (Pt is feeling weak due to patient needing food and water,)              Stairs            Wheelchair Mobility    Modified Rankin (Stroke Patients Only)       Balance Overall balance assessment: Needs assistance Sitting-balance support: Bilateral upper extremity supported Sitting balance-Leahy Scale: Fair   Postural control: Posterior lean     Standing balance comment: (NT)                             Pertinent Vitals/Pain Pain Assessment: 0-10 Pain Score: 10-Worst pain ever Pain Location: L foot Pain Descriptors / Indicators: Aching Pain Intervention(s): Limited activity within patient's tolerance;Monitored during session    Home Living Family/patient expects to be discharged to:: Private residence Living Arrangements: Alone Available Help at Discharge: Family Type of Home: House Home Access: Stairs to enter Entrance Stairs-Rails: None Entrance Stairs-Number of Steps: 2 Home Layout: One level        Prior Function Level of Independence: Independent  with assistive device(s)               Hand Dominance        Extremity/Trunk Assessment   Upper Extremity Assessment Upper Extremity Assessment: Overall WFL for tasks assessed    Lower Extremity Assessment Lower Extremity Assessment: Generalized weakness;RLE deficits/detail;LLE deficits/detail RLE Deficits / Details: (-3/5 hip flex, 3/5 knee extension) LLE Deficits / Details: (-3/5 hip flex, 3/5 knee extension)       Communication   Communication: No difficulties  Cognition Arousal/Alertness: Awake/alert Behavior During Therapy: WFL for tasks assessed/performed Overall Cognitive Status: Within Functional Limits for tasks  assessed                                 General Comments: she reports that she feels weak,(pt reports nothing to eat or drink today)      General Comments      Exercises     Assessment/Plan    PT Assessment Patient needs continued PT services  PT Problem List Decreased strength;Decreased activity tolerance;Decreased balance;Pain       PT Treatment Interventions Gait training;Functional mobility training;Therapeutic activities;Therapeutic exercise;Balance training;Neuromuscular re-education;Wheelchair mobility training    PT Goals (Current goals can be found in the Care Plan section)  Acute Rehab PT Goals Patient Stated Goal: no goasl stated PT Goal Formulation: Patient unable to participate in goal setting Time For Goal Achievement: 06/26/19 Potential to Achieve Goals: Fair    Frequency Min 2X/week   Barriers to discharge Decreased caregiver support      Co-evaluation               AM-PAC PT "6 Clicks" Mobility  Outcome Measure Help needed turning from your back to your side while in a flat bed without using bedrails?: A Lot Help needed moving from lying on your back to sitting on the side of a flat bed without using bedrails?: A Lot Help needed moving to and from a bed to a chair (including a wheelchair)?: A Lot Help needed standing up from a chair using your arms (e.g., wheelchair or bedside chair)?: A Lot Help needed to walk in hospital room?: A Lot Help needed climbing 3-5 steps with a railing? : A Lot 6 Click Score: 12    End of Session Equipment Utilized During Treatment: Gait belt Activity Tolerance: Patient limited by fatigue;Patient limited by lethargy Patient left: Other (comment)(on stretcher in ER with food tray, nsg notified of rail down)   PT Visit Diagnosis: Unsteadiness on feet (R26.81);Muscle weakness (generalized) (M62.81);History of falling (Z91.81);Difficulty in walking, not elsewhere classified (R26.2)    Time:  9563-8756 PT Time Calculation (min) (ACUTE ONLY): 20 min   Charges:   PT Evaluation $PT Eval Low Complexity: 1 Low PT Treatments $Therapeutic Activity: 8-22 mins          Alanson Puls, PT DPT 06/12/2019, 10:42 AM

## 2019-06-13 DIAGNOSIS — R531 Weakness: Secondary | ICD-10-CM

## 2019-06-13 DIAGNOSIS — Z794 Long term (current) use of insulin: Secondary | ICD-10-CM

## 2019-06-13 DIAGNOSIS — N179 Acute kidney failure, unspecified: Secondary | ICD-10-CM

## 2019-06-13 DIAGNOSIS — E1165 Type 2 diabetes mellitus with hyperglycemia: Secondary | ICD-10-CM

## 2019-06-13 DIAGNOSIS — D649 Anemia, unspecified: Secondary | ICD-10-CM

## 2019-06-13 LAB — GLUCOSE, CAPILLARY
Glucose-Capillary: 269 mg/dL — ABNORMAL HIGH (ref 70–99)
Glucose-Capillary: 282 mg/dL — ABNORMAL HIGH (ref 70–99)
Glucose-Capillary: 294 mg/dL — ABNORMAL HIGH (ref 70–99)

## 2019-06-13 LAB — BASIC METABOLIC PANEL
Anion gap: 5 (ref 5–15)
BUN: 10 mg/dL (ref 8–23)
CO2: 27 mmol/L (ref 22–32)
Calcium: 8.1 mg/dL — ABNORMAL LOW (ref 8.9–10.3)
Chloride: 103 mmol/L (ref 98–111)
Creatinine, Ser: 0.61 mg/dL (ref 0.44–1.00)
GFR calc Af Amer: >60 mL/min (ref >60)
GFR calc non Af Amer: >60 mL/min (ref >60)
Glucose, Bld: 284 mg/dL — ABNORMAL HIGH (ref 70–99)
Potassium: 4.2 mmol/L (ref 3.5–5.1)
Sodium: 135 mmol/L (ref 135–145)

## 2019-06-13 LAB — CBC
HCT: 27.2 % — ABNORMAL LOW (ref 36.0–46.0)
Hemoglobin: 8 g/dL — ABNORMAL LOW (ref 12.0–15.0)
MCH: 22.7 pg — ABNORMAL LOW (ref 26.0–34.0)
MCHC: 29.4 g/dL — ABNORMAL LOW (ref 30.0–36.0)
MCV: 77.3 fL — ABNORMAL LOW (ref 80.0–100.0)
Platelets: 348 10*3/uL (ref 150–400)
RBC: 3.52 MIL/uL — ABNORMAL LOW (ref 3.87–5.11)
RDW: 17.6 % — ABNORMAL HIGH (ref 11.5–15.5)
WBC: 5.7 10*3/uL (ref 4.0–10.5)
nRBC: 0 % (ref 0.0–0.2)

## 2019-06-13 LAB — HEMOGLOBIN A1C
Hgb A1c MFr Bld: 14.9 % — ABNORMAL HIGH (ref 4.8–5.6)
Mean Plasma Glucose: 381 mg/dL

## 2019-06-13 MED ORDER — FE FUMARATE-B12-VIT C-FA-IFC PO CAPS: 1.0000 | ORAL_CAPSULE | Freq: Two times a day (BID) | ORAL | 1 refills | Status: AC

## 2019-06-13 MED ORDER — LANTUS SOLOSTAR 100 UNIT/ML ~~LOC~~ SOPN: 36.0000 [IU] | PEN_INJECTOR | Freq: Every evening | SUBCUTANEOUS | 11 refills | Status: AC

## 2019-06-13 NOTE — Progress Notes (Signed)
Physical Therapy Treatment Patient Details Name: Leah Powell MRN: [id number] DOB: 05-07-1955 Today's Date: 06/13/2019    History of Present Illness Per MD note:Leah Powell  is a 64 y.o. Caucasian female with a known history of type diabetes mellitus, hypertension, and anxiety, dyslipidemia and ongoing tobacco abuse, presented to emergency room with acute onset of fall.  She stated that she was lying in bed and get up to close the blinds and go to the bathroom when her legs gave way.  She stated that her knees went out and she fell sitting on her buttock.  No head injury.  She denied loss of balance, weakness or paresthesias.  No chest pain or dyspnea or palpitations.  She had right knee surgery in the past.  No urinary frequency or urgency or dysuria or hematuria or flank pain.  No nausea or vomiting.  Orthopedic imaging (pelvis, L-spine, L knee negative for acute changes)    PT Comments    Patient with significant improvement in pain control, overall activity tolerance and functional performance this date.  Completing all transfers and gait activities with RW, sup/mod indep.  Good LE strength and stability; no episodes of buckling or LOB noted.  Able to safely negotiate home environment at current level; patient does prefer continued use of RW for optimal safety/stability.  Comfortable with upcoming discharge home; no additional questions/concerns at this time.     Follow Up Recommendations  Outpatient PT     Equipment Recommendations  Rolling walker with 5" wheels    Recommendations for Other Services       Precautions / Restrictions Precautions Precautions: Fall Restrictions Weight Bearing Restrictions: No    Mobility  Bed Mobility Overal bed mobility: Modified Independent                Transfers Overall transfer level: Needs assistance Equipment used: Rolling walker (2 wheeled) Transfers: Sit to/from Stand Sit to Stand: Supervision         General  transfer comment: cuing for hand placement to prevent pulling on RW  Ambulation/Gait Ambulation/Gait assistance: Supervision Gait Distance (Feet): 200 Feet Assistive device: Rolling walker (2 wheeled)   Gait velocity: 10' walk time, 10-11 seconds   General Gait Details: reciprocal stepping pattern with good step height/length, good LE WBing/stability bilat; fair cadence; no buckling, LOB or safety concern. Does prefer continued use of RW for optimal stability.   Stairs             Wheelchair Mobility    Modified Rankin (Stroke Patients Only)       Balance Overall balance assessment: Needs assistance Sitting-balance support: No upper extremity supported;Feet supported Sitting balance-Leahy Scale: Normal     Standing balance support: Bilateral upper extremity supported Standing balance-Leahy Scale: Good                              Cognition Arousal/Alertness: Awake/alert Behavior During Therapy: WFL for tasks assessed/performed Overall Cognitive Status: Within Functional Limits for tasks assessed                                        Exercises Other Exercises Other Exercises: Toilet transfer, ambulatory with RW, sup/mod indep; sit/stand from standard height toilet, sup/mod indep with RW; standing balance for hand hygiene at sink, sup. Good awareness of safety needs and limits of stability. Other Exercises: Reviewed  car transfer technique; patient voiced understanding and agreement. Other Exercises: Adjusted walker to appropriate height for use upon discharge    General Comments        Pertinent Vitals/Pain Pain Assessment: No/denies pain    Home Living                      Prior Function            PT Goals (current goals can now be found in the care plan section) Acute Rehab PT Goals Patient Stated Goal: no goasl stated PT Goal Formulation: Patient unable to participate in goal setting Time For Goal Achievement:  06/26/19 Potential to Achieve Goals: Good Progress towards PT goals: Progressing toward goals    Frequency    Min 2X/week      PT Plan Current plan remains appropriate    Co-evaluation              AM-PAC PT "6 Clicks" Mobility   Outcome Measure  Help needed turning from your back to your side while in a flat bed without using bedrails?: None Help needed moving from lying on your back to sitting on the side of a flat bed without using bedrails?: None Help needed moving to and from a bed to a chair (including a wheelchair)?: None Help needed standing up from a chair using your arms (e.g., wheelchair or bedside chair)?: None Help needed to walk in hospital room?: A Little Help needed climbing 3-5 steps with a railing? : A Little 6 Click Score: 22    End of Session Equipment Utilized During Treatment: Gait belt Activity Tolerance: Patient tolerated treatment well Patient left: in chair;with call bell/phone within reach;with chair alarm set Nurse Communication: Mobility status PT Visit Diagnosis: Unsteadiness on feet (R26.81);Muscle weakness (generalized) (M62.81);History of falling (Z91.81);Difficulty in walking, not elsewhere classified (R26.2)     Time: 1534-1600 PT Time Calculation (min) (ACUTE ONLY): 26 min  Charges:  $Gait Training: 8-22 mins $Therapeutic Activity: 8-22 mins                     Ben Sanz H. Owens Shark, PT, DPT, NCS 06/13/19, 4:14 PM [phone]

## 2019-06-13 NOTE — Discharge Summary (Signed)
Physician Discharge Summary  Leah Powell JOA:[id number] DOB: 1955-04-01 DOA: 06/11/2019  PCP: System, Pcp Not In  Admit date: 06/11/2019 Discharge date: 06/13/2019  Admitted From: Home Disposition:  Home  Recommendations for Outpatient Follow-up:  1. Follow up with PCP in 1-2 weeks 2. Please obtain BMP/CBC in one week 3. Please follow up on the following pending results:None  Home Health:Yes Equipment/Devices: Rolling walker Discharge Condition: Fair CODE STATUS: Full Diet recommendation: Heart Healthy / Carb Modified   Brief/Interim Summary: SherreWilliamsis a63 y.o.Caucasian femalewith a known history of type diabetes mellitus, hypertension, and anxiety, dyslipidemia and ongoing tobacco abuse, presented to emergency room with acute onset of fall. She stated that she was lying in bed and get up to close the blinds and go to the bathroom when her legs gave way. She stated that her knees went out and she fell sitting on her buttock. No head injury. She denied loss of balance, weakness or paresthesias. No chest pain or dyspnea or palpitations. She had right knee surgery in the past. No urinary frequency or urgency or dysuria or hematuria or flank pain. No nausea or vomiting.  Imaging negative for any acute injury or fractures.  Found to have hyperglycemia. Per patient she was not using her insulin regularly.  Recently increased dose causing some blurry vision so she decreased her dose. She was on narcotics for her knee pain, and her PCP is trying to decrease the dose due to falls and generalized weakness.  Patient still on multiple psych medications which can cause weakness and dizziness.  We discontinued Klonopin as patient was on Valium too.  She needs to follow-up with her primary care physician and psychiatrist for further management of her polypharmacy.  PT is recommending SNF placement, wants to go home with home health services as she is in the middle of move and will  follow up with his primary care physician for further recommendations.  Patient has uncontrolled diabetes with A1c of 14.9.  We explained to her that blurry vision was most likely secondary to elevated blood sugar level and start of insulin as her morning blood glucose level never dropped below 140.  She was instructed to continue using 36 units at bedtime along with her other medications and follow-up with primary care physician closely for titration and a tight control of her diabetes.  Patient also found to have iron deficiency anemia along with borderline vitamin B12.  She was given IV iron x1 and discharged on supplement with iron and vitamin B12.  PCP should be able to titrate and monitor.  She will continue rest of her home meds.  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 Please keep yourself well-hydrated. Please follow-up with your primary care physician and work with him to decrease the dose of your pain medications.  You are on multiple medications which can cause weakness and dizziness. If your insurance does not allow home health services then she will given a referral to see an physical therapist as an outpatient.   Increase activity slowly  Complete by: As directed      Allergies as of 06/13/2019      Reactions   Budesonide-formoterol Fumarate Swelling   Carvedilol Swelling   Penicillin G Hives   Sennosides-docusate Sodium Rash      Medication List    STOP taking these medications   clonazePAM 0.5 MG tablet Commonly known as: KLONOPIN     TAKE these  medications   acetaminophen 500 MG tablet Commonly known as: TYLENOL Take by mouth. Take 2 tablets (1,000 mg total) by mouth every six (6) hours as needed for pain.   atorvastatin 40 MG tablet Commonly known as: LIPITOR Take by mouth. Take 1 tablet (40 mg total) by mouth daily. For cholesterol   bisacodyl 10 MG suppository Commonly known as: DULCOLAX Place 10 mg rectally as needed.   busPIRone 10 MG tablet Commonly known as: BUSPAR Take by mouth. Take 1 tablet (10 mg total) by mouth Four (4) times a day.   Cholecalciferol 25 MCG (1000 UT) tablet Take 1,000 Units by mouth daily.   clopidogrel 75 MG tablet Commonly known as: PLAVIX Take 75 mg by mouth daily.   diazepam 5 MG tablet Commonly known as: VALIUM Take by mouth. Take ONE AND ONE-HALF tablets (7.5 mg total) by mouth every morning AND ONE HALF tablet (2.5 mg total) nightly.   esomeprazole 40 MG capsule Commonly known as: NEXIUM Take 40 mg by mouth daily.   ezetimibe 10 MG tablet Commonly known as: ZETIA Take 10 mg by mouth daily.   ferrous [id number] C-folic acid capsule Commonly known as: TRINSICON / FOLTRIN Take 1 capsule by mouth 2 (two) times daily after a meal.   gabapentin 400 MG capsule Commonly known as: NEURONTIN Take by mouth. Take 3 capsules (1,200 mg total) by mouth Three (3) times a day.   Lantus SoloStar 100 UNIT/ML Solostar Pen Generic drug: insulin glargine Inject 36 Units into the skin at bedtime. Inject 0.32 mL (32 units total) under the skin nightly What changed: how much to take   lidocaine 5 % ointment Commonly known as: XYLOCAINE Apply to back of left arm twice a day as needed   liraglutide 18 MG/3ML Sopn Commonly known as: VICTOZA Inject into the skin. Inject 0.3 mL (1.8 mg total) under the skin daily.   melatonin 3 MG Tabs tablet Take by mouth. Take 3 tablets (9 mg total) by mouth nightly.   metFORMIN 1000 MG tablet Commonly known as: GLUCOPHAGE Take by mouth. Take  1,000 mg by mouth 2 (two) times daily with meals   nicotine 21 mg/24hr patch Commonly known as: NICODERM CQ - dosed in mg/24 hours Place onto the skin. Place 1 patch on the skin daily. Place patch on hairless skin on upper body, including arms and back. Each day: discard old patch, shower, apply new patch to different site.   nicotine polacrilex 2 MG lozenge Commonly known as: COMMIT Use 1 lozenge every 1-2 hours as needed instead of a cigarette   nitroGLYCERIN 0.4 MG SL tablet Commonly known as: NITROSTAT Place under the tongue. Place 1 tablet (0.4 mg total) under the tongue every five (5) minutes as needed for chest pain. Maximum of 3 doses in 15 minutes.   oxyCODONE 5 MG immediate release tablet Commonly known as: Oxy IR/ROXICODONE Take by mouth.  Take 1 tablet (5 mg total) by mouth every eight (8) hours as needed for pain.   valsartan 80 MG tablet Commonly known as: DIOVAN Take by mouth. Take 1 tablet (80  mg total) by mouth daily.            Durable Medical Equipment  (From admission, onward)         Start     Ordered   06/13/19 1248  For home use only DME Walker rolling  Once    Question Answer Comment  Walker: With 5 Inch Wheels   Patient needs a walker to treat with the following condition Frequent falls      06/13/19 1247          Allergies  Allergen Reactions  . Budesonide-Formoterol Fumarate Swelling  . Carvedilol Swelling  . Penicillin G Hives  . Sennosides-Docusate Sodium Rash    Consultations:  Orthopedic.  Procedures/Studies: DG Lumbar Spine Complete  Result Date: 06/11/2019 CLINICAL DATA:  Left leg weakness, pain EXAM: LUMBAR SPINE - COMPLETE 4+ VIEW COMPARISON:  None FINDINGS: Five lumbar type vertebral bodies. Normal lumbar lordosis. No evidence of fracture or dislocation. Vertebral body heights and intervertebral disc spaces are maintained. Visualized bony pelvis appears intact. Vascular calcifications. IMPRESSION: Negative. Electronically  Signed   By: Charline Bills M.D.   On: 06/11/2019 23:49   DG Knee Complete 4 Views Left  Result Date: 06/11/2019 CLINICAL DATA:  Left leg weakness EXAM: LEFT KNEE - COMPLETE 4+ VIEW COMPARISON:  None. FINDINGS: No fracture or dislocation is seen. Mild degenerative changes with lateral compartment osteophytosis. The visualized soft tissues are unremarkable. No suprapatellar knee joint effusion. IMPRESSION: Mild degenerative changes of the lateral compartment. Electronically Signed   By: Charline Bills M.D.   On: 06/11/2019 23:48   DG Hip Unilat W or Wo Pelvis 2-3 Views Left  Result Date: 06/11/2019 CLINICAL DATA:  Left leg weakness, pain EXAM: DG HIP (WITH OR WITHOUT PELVIS) 2-3V LEFT COMPARISON:  None. FINDINGS: No fracture or dislocation is seen. The joint spaces are preserved. Visualized bony pelvis appears intact. IMPRESSION: Negative. Electronically Signed   By: Charline Bills M.D.   On: 06/11/2019 23:49     Subjective: Patient has no new complaints today.  She wants to go back home as she was in the middle of move.  Discharge Exam: Vitals:   06/13/19 0013 06/13/19 0817  BP: (!) 116/52 124/73  Pulse: 84 78  Resp: 18 16  Temp: 98.3 F (36.8 C) 97.9 F (36.6 C)  SpO2: 93% 96%   Vitals:   06/12/19 1150 06/12/19 1624 06/13/19 0013 06/13/19 0817  BP:  119/67 (!) 116/52 124/73  Pulse:  89 84 78  Resp:  Temp:  98.3 F (36.8 C) 98.3 F (36.8 C) 97.9 F (36.6 C)  TempSrc:  Oral Oral   SpO2:  94% 93% 96%  Weight:      Height:  (1.6 m)       General: Pt is alert, awake, not in acute distress Cardiovascular: RRR, S1/S2 +, no rubs, no gallops Respiratory: CTA bilaterally, no wheezing, no rhonchi Abdominal: Soft, NT, ND, bowel sounds + Extremities: no edema, no cyanosis   The results of significant diagnostics from this hospitalization (including imaging, microbiology, ancillary and laboratory) are listed below for reference.    Microbiology: Recent  Results (from the past 240 hour(s))  Respiratory Panel by RT PCR (Flu A&B, Covid) - Nasopharyngeal Swab     Status: None   Collection Time: 06/12/19  4:51 AM   Specimen: Nasopharyngeal Swab  Result Value Ref Range Status   SARS Coronavirus 2 by RT PCR NEGATIVE NEGATIVE Final  Comment: (NOTE) SARS-CoV-2 target nucleic acids are NOT DETECTED. The SARS-CoV-2 RNA is generally detectable in upper respiratoy specimens during the acute phase of infection. The lowest concentration of SARS-CoV-2 viral copies this assay can detect is 131 copies/mL. A negative result does not preclude SARS-Cov-2 infection and should not be used as the sole basis for treatment or other patient management decisions. A negative result may occur with  improper specimen collection/handling, submission of specimen other than nasopharyngeal swab, presence of viral mutation(s) within the areas targeted by this assay, and inadequate number of viral copies (<131 copies/mL). A negative result must be combined with clinical observations, patient history, and epidemiological information. The expected result is Negative. Fact Sheet for Patients:  https://www.moore.com/https://www.fda.gov/media/142436/download Fact Sheet for Healthcare Providers:  https://www.young.biz/https://www.fda.gov/media/142435/download This test is not yet ap proved or cleared by the [place] FDA and  has been authorized for detection and/or diagnosis of SARS-CoV-2 by FDA under an Emergency Use Authorization (EUA). This EUA will remain  in effect (meaning this test can be used) for the duration of the COVID-19 declaration under Section 564(b)(1) of the Act, 21 U.S.C. section 360bbb-3(b)(1), unless the authorization is terminated or revoked sooner.    Influenza A by PCR NEGATIVE NEGATIVE Final   Influenza B by PCR NEGATIVE NEGATIVE Final    Comment: (NOTE) The Xpert Xpress SARS-CoV-2/FLU/RSV assay is intended as an aid in  the diagnosis of influenza from Nasopharyngeal swab specimens  and  should not be used as a sole basis for treatment. Nasal washings and  aspirates are unacceptable for Xpert Xpress SARS-CoV-2/FLU/RSV  testing. Fact Sheet for Patients: https://www.moore.com/https://www.fda.gov/media/142436/download Fact Sheet for Healthcare Providers: https://www.young.biz/https://www.fda.gov/media/142435/download This test is not yet approved or cleared by the [place] FDA and  has been authorized for detection and/or diagnosis of SARS-CoV-2 by  FDA under an Emergency Use Authorization (EUA). This EUA will remain  in effect (meaning this test can be used) for the duration of the  Covid-19 declaration under Section 564(b)(1) of the Act, 21  U.S.C. section 360bbb-3(b)(1), unless the authorization is  terminated or revoked. Performed at [facility], [address] [place], [place] [address]      Labs: BNP (last 3 results) No results for input(s): BNP in the last 8760 hours. Basic Metabolic Panel: Recent Labs  Lab 06/12/19 0113 06/12/19 0452 06/13/19 0608  NA 126* 131* 135  K 5.1 4.0 4.2  CL 88* 93* 103  CO2 26 28 27   GLUCOSE 579* 317* 284*  BUN 17 15 10   CREATININE 1.13* 0.83 0.61  CALCIUM 8.9 8.8* 8.1*   Liver Function Tests: No results for input(s): AST, ALT, ALKPHOS, BILITOT, PROT, ALBUMIN in the last 168 hours. No results for input(s): LIPASE, AMYLASE in the last 168 hours. No results for input(s): AMMONIA in the last 168 hours. CBC: Recent Labs  Lab 06/12/19 0113 06/12/19 0452 06/13/19 0608  WBC 9.3 9.8 5.7  NEUTROABS 5.0  --   --   HGB 8.5* 8.0* 8.0*  HCT 29.0* 27.1* 27.2*  MCV 78.2* 77.7* 77.3*  PLT 366 344 348   Cardiac Enzymes: No results for input(s): CKTOTAL, CKMB, CKMBINDEX, TROPONINI in the last 168 hours. BNP: Invalid input(s): POCBNP CBG: Recent Labs  Lab 06/12/19 1205 06/12/19 1648 06/12/19 2048 06/13/19 0812 06/13/19 1219  GLUCAP 211* 191* 250* 269* 282*   D-Dimer No results for input(s): DDIMER in the last 72 hours. Hgb A1c Recent  Labs    06/12/19 0452  HGBA1C 14.9*   Lipid Profile  No results for input(s): CHOL, HDL, LDLCALC, TRIG, CHOLHDL, LDLDIRECT in the last 72 hours. Thyroid function studies No results for input(s): TSH, T4TOTAL, T3FREE, THYROIDAB in the last 72 hours.  Invalid input(s): FREET3 Anemia work up Recent Labs    06/12/19 1237  VITAMINB12 293  FOLATE 18.3  FERRITIN 6*  TIBC 434  IRON 12*  RETICCTPCT 2.2   Urinalysis    Component Value Date/Time   COLORURINE STRAW (A) 06/12/2019 0113   APPEARANCEUR CLEAR (A) 06/12/2019 0113   LABSPEC 1.025 06/12/2019 0113   PHURINE 5.0 06/12/2019 0113   GLUCOSEU >=500 (A) 06/12/2019 0113   HGBUR NEGATIVE 06/12/2019 0113   BILIRUBINUR NEGATIVE 06/12/2019 0113   KETONESUR NEGATIVE 06/12/2019 0113   PROTEINUR NEGATIVE 06/12/2019 0113   NITRITE NEGATIVE 06/12/2019 0113   LEUKOCYTESUR NEGATIVE 06/12/2019 0113   Sepsis Labs Invalid input(s): PROCALCITONIN,  WBC,  LACTICIDVEN Microbiology Recent Results (from the past 240 hour(s))  Respiratory Panel by RT PCR (Flu A&B, Covid) - Nasopharyngeal Swab     Status: None   Collection Time: 06/12/19  4:51 AM   Specimen: Nasopharyngeal Swab  Result Value Ref Range Status   SARS Coronavirus 2 by RT PCR NEGATIVE NEGATIVE Final    Comment: (NOTE) SARS-CoV-2 target nucleic acids are NOT DETECTED. The SARS-CoV-2 RNA is generally detectable in upper respiratoy specimens during the acute phase of infection. The lowest concentration of SARS-CoV-2 viral copies this assay can detect is 131 copies/mL. A negative result does not preclude SARS-Cov-2 infection and should not be used as the sole basis for treatment or other patient management decisions. A negative result may occur with  improper specimen collection/handling, submission of specimen other than nasopharyngeal swab, presence of viral mutation(s) within the areas targeted by this assay, and inadequate number of viral copies (<131 copies/mL). A negative  result must be combined with clinical observations, patient history, and epidemiological information. The expected result is Negative. Fact Sheet for Patients:  https://www.moore.com/ Fact Sheet for Healthcare Providers:  https://www.young.biz/ This test is not yet ap proved or cleared by the [place] FDA and  has been authorized for detection and/or diagnosis of SARS-CoV-2 by FDA under an Emergency Use Authorization (EUA). This EUA will remain  in effect (meaning this test can be used) for the duration of the COVID-19 declaration under Section 564(b)(1) of the Act, 21 U.S.C. section 360bbb-3(b)(1), unless the authorization is terminated or revoked sooner.    Influenza A by PCR NEGATIVE NEGATIVE Final   Influenza B by PCR NEGATIVE NEGATIVE Final    Comment: (NOTE) The Xpert Xpress SARS-CoV-2/FLU/RSV assay is intended as an aid in  the diagnosis of influenza from Nasopharyngeal swab specimens and  should not be used as a sole basis for treatment. Nasal washings and  aspirates are unacceptable for Xpert Xpress SARS-CoV-2/FLU/RSV  testing. Fact Sheet for Patients: https://www.moore.com/ Fact Sheet for Healthcare Providers: https://www.young.biz/ This test is not yet approved or cleared by the [place] FDA and  has been authorized for detection and/or diagnosis of SARS-CoV-2 by  FDA under an Emergency Use Authorization (EUA). This EUA will remain  in effect (meaning this test can be used) for the duration of the  Covid-19 declaration under Section 564(b)(1) of the Act, 21  U.S.C. section 360bbb-3(b)(1), unless the authorization is  terminated or revoked. Performed at [facility], [address] [place], [place] [address]     Time coordinating discharge: Over 30 minutes  SIGNED:  Arnetha Courser, MD  [facility] 06/13/2019, 1:04  PM  If 7PM-7AM, please contact  night-coverage [email or web address]  This record has been created using Conservation officer, historic buildings. Errors have been sought and corrected,but may not always be located. Such creation errors do not reflect on the standard of care.

## 2019-06-13 NOTE — Progress Notes (Signed)
Subjective:  Patient states she is feeling much better today.  She states that she was able to participate well with physical therapy.  She is up out of bed to a chair now.  Objective:   VITALS:   Vitals:   06/12/19 1150 06/12/19 1624 06/13/19 0013 06/13/19 0817  BP:  119/67 (!) 116/52 124/73  Pulse:  89 84 78  Resp:  16 18 16   Temp:  98.3 F (36.8 C) 98.3 F (36.8 C) 97.9 F (36.6 C)  TempSrc:  Oral Oral   SpO2:  94% 93% 96%  Weight:      Height: 5\' 3"  (1.6 m)       PHYSICAL EXAM: Left lower extremity Neurovascular intact Sensation intact distally Intact pulses distally Dorsiflexion/Plantar flexion intact No cellulitis present Compartment soft  LABS  Results for orders placed or performed during the hospital encounter of 06/11/19 (from the past 24 hour(s))  Glucose, capillary     Status: Abnormal   Collection Time: 06/12/19  4:48 PM  Result Value Ref Range   Glucose-Capillary 191 (H) 70 - 99 mg/dL  Glucose, capillary     Status: Abnormal   Collection Time: 06/12/19  8:48 PM  Result Value Ref Range   Glucose-Capillary 250 (H) 70 - 99 mg/dL   Comment 1 Notify RN   CBC     Status: Abnormal   Collection Time: 06/13/19  6:08 AM  Result Value Ref Range   WBC 5.7 4.0 - 10.5 K/uL   RBC 3.52 (L) 3.87 - 5.11 MIL/uL   Hemoglobin 8.0 (L) 12.0 - 15.0 g/dL   HCT 27.2 (L) 36.0 - 46.0 %   MCV 77.3 (L) 80.0 - 100.0 fL   MCH 22.7 (L) 26.0 - 34.0 pg   MCHC 29.4 (L) 30.0 - 36.0 g/dL   RDW 17.6 (H) 11.5 - 15.5 %   Platelets 348 150 - 400 K/uL   nRBC 0.0 0.0 - 0.2 %  Basic metabolic panel     Status: Abnormal   Collection Time: 06/13/19  6:08 AM  Result Value Ref Range   Sodium 135 135 - 145 mmol/L   Potassium 4.2 3.5 - 5.1 mmol/L   Chloride 103 98 - 111 mmol/L   CO2 27 22 - 32 mmol/L   Glucose, Bld 284 (H) 70 - 99 mg/dL   BUN 10 8 - 23 mg/dL   Creatinine, Ser 0.61 0.44 - 1.00 mg/dL   Calcium 8.1 (L) 8.9 - 10.3 mg/dL   GFR calc non Af Amer >60 >60 mL/min   GFR calc Af  Amer >60 >60 mL/min   Anion gap 5 5 - 15  Glucose, capillary     Status: Abnormal   Collection Time: 06/13/19  8:12 AM  Result Value Ref Range   Glucose-Capillary 269 (H) 70 - 99 mg/dL  Glucose, capillary     Status: Abnormal   Collection Time: 06/13/19 12:19 PM  Result Value Ref Range   Glucose-Capillary 282 (H) 70 - 99 mg/dL    DG Lumbar Spine Complete  Result Date: 06/11/2019 CLINICAL DATA:  Left leg weakness, pain EXAM: LUMBAR SPINE - COMPLETE 4+ VIEW COMPARISON:  None FINDINGS: Five lumbar type vertebral bodies. Normal lumbar lordosis. No evidence of fracture or dislocation. Vertebral body heights and intervertebral disc spaces are maintained. Visualized bony pelvis appears intact. Vascular calcifications. IMPRESSION: Negative. Electronically Signed   By: Julian Hy M.D.   On: 06/11/2019 23:49   DG Knee Complete 4 Views Left  Result Date:  06/11/2019 CLINICAL DATA:  Left leg weakness EXAM: LEFT KNEE - COMPLETE 4+ VIEW COMPARISON:  None. FINDINGS: No fracture or dislocation is seen. Mild degenerative changes with lateral compartment osteophytosis. The visualized soft tissues are unremarkable. No suprapatellar knee joint effusion. IMPRESSION: Mild degenerative changes of the lateral compartment. Electronically Signed   By: Charline Bills M.D.   On: 06/11/2019 23:48   DG Hip Unilat W or Wo Pelvis 2-3 Views Left  Result Date: 06/11/2019 CLINICAL DATA:  Left leg weakness, pain EXAM: DG HIP (WITH OR WITHOUT PELVIS) 2-3V LEFT COMPARISON:  None. FINDINGS: No fracture or dislocation is seen. The joint spaces are preserved. Visualized bony pelvis appears intact. IMPRESSION: Negative. Electronically Signed   By: Charline Bills M.D.   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  Patient is in the process of moving.  When she gets settled she will work into formalized physical therapy.  Patient may follow-up in our office on a as needed basis.    Leah Powell , MD 06/13/2019, 4:14 PM

## 2019-06-13 NOTE — TOC Progression Note (Signed)
Transition of Care [facility]) - Progression Note    Patient Details  Name: Leah Powell MRN: [id number] Date of Birth: [birth year]  [facility] CM/SW Contact  Barrie Dunker, RN Phone Number: 06/13/2019, 3:15 PM  Clinical Narrative:    Spoke with the patient about DC needs and plan, she lives alone but is in the process of moving in with her mother, she needs a RW, I contacted Brad with [employer] and a RW was provided for her ot take home, Due to having Medicaid was unable to find a [facility] agency and the patient is not home bound, she is agreeable to go to outpatient PT if she can go once she is settled, I let her know that they would call her to set up with appointment, [facility] the referral to [facility] outpatient PT.  Patient is agreeable and is ready to DC once PT sees her again        Expected Discharge Plan and Services           Expected Discharge Date: 06/13/19                                     Social Determinants of Health (SDOH) Interventions    Readmission Risk Interventions No flowsheet data found.

## 2019-06-13 NOTE — Progress Notes (Signed)
Inpatient Diabetes Program Recommendations  AACE/ADA: New Consensus Statement on Inpatient Glycemic Control   Target Ranges:  Prepandial:   less than 140 mg/dL      Peak postprandial:   less than 180 mg/dL (1-2 hours)      Critically ill patients:  140 - 180 mg/dL  Results for Leah Powell, Leah Powell (MRN [id number]) as of 06/13/2019 08:56  Ref. Range 06/12/2019 12:05 06/12/2019 16:48 06/12/2019 20:48 06/13/2019 08:12  Glucose-Capillary Latest Ref Range: 70 - 99 mg/dL 974 (H) 163 (H) 845 (H) 269 (H)    Review of Glycemic Control  Diabetes history: DM2 Outpatient Diabetes medications: Lantus 32 units QHS, Victoza 1.8 mg daily, Metformin 1000 mg BID Current orders for Inpatient glycemic control: Lantus 30 units QHS, Novolog 0-20 units TID with meals, Novolog 0-5 units QHS, Novolog 4 units TID with meals  Inpatient Diabetes Program Recommendations:   Insulin - Basal: Please consider increasing Lantus to 35 units QHS.  Insulin - Meal Coverage: Please consider increasing meal coverage to Novolog 7 units TID with meals if patient eats at least 50% of meals.  HgbA1C: Per Care Everywhere, patient's last A1C was 14.5% on 04/26/19 (per [facility] Endocirnologist note on 05/04/19).  NOTE: In reviewing chart, noted patient sees [facility] Endocrinology for DM management. Per Care Everywhere, patient seen Dr. Alphia Kava on 05/04/19 and notes last A1C 14.5% on 04/26/19. Per note on 05/04/19, patient was asked to increase Lantus to 36 units QHS and continue Metformin and Victoza as already prescribed. Patient's initial glucose was 579 mg/dl on 3/64/68. Fasting glucose 289 mg/dl today. Spoke with patient over the phone and she reports that she is taking Lantus 32 units QHS, Victoza 1.8 mg daily, and Metformin 1000 mg BID. Inquired about increased dose of Lantus 36 units QHS as recommended by Endocrinology on 05/04/19 and patient states that she started taking the increased dose of Lantus 36 units and she noticed that she starting having issues  with her vision (blurry) so she decreased dose back down to 32 units of Lantus. Patient states that when she took the 36 units her fasting glucose was 150-180's mg/dl and she denies any issues with hypoglycemia. Explained how hyperglycemia leads to damage within blood vessels which lead to the common complications seen with uncontrolled diabetes. Explained that blurry vision would likely be more related to hyperglycemia and not increased dose of Lantus.  Stressed to the patient the importance of improving glycemic control to prevent further complications from uncontrolled diabetes. Discussed impact of nutrition, exercise, stress, sickness, and medications on diabetes control.  Patient states that she has been taking DM medications consistently and she has made several changes with diet to try to get DM under better control. Patient admits that she has been under a significant amount of stress lately which she feels is contributing to hyperglycemia. Reviewed last A1C of 14.5% from 04/26/19 and explained that A1C indicates an average glucose of 369 mg/dl. Discussed A1C and glucose goals. Encouraged patient to take Lantus 36 units daily as prescribed by Endocrinologist (or as directed by MD at time of discharge from hospital), follow up with Endocrinologist, call to schedule eye exam, and check glucose as MD directs.  Patient verbalized understanding of information discussed and reports no further questions at this time related to diabetes.   Thanks, Orlando Penner, RN, MSN, CDE Diabetes Coordinator Inpatient Diabetes Program [phone] (Team Pager from 8am to 5pm)

## 2019-06-13 NOTE — Progress Notes (Signed)
DISCHARGE NOTE:  Pt given discharge instructions by SWOT nurse, Beth. Walker went with pt. Pt wheeled to car by staff.

## 2020-08-20 IMAGING — CR DG LUMBAR SPINE COMPLETE 4+V
1 series · 5 of 5 positions shown · non-contrast
Comparison: None

CLINICAL DATA: Left leg weakness, pain

EXAM:
LUMBAR SPINE - COMPLETE 4+ VIEW

[Series 1: dg lumbar spine complete 4 +v · 0.14mm/px · 5 of 5 slices shown]
[im 1/5]
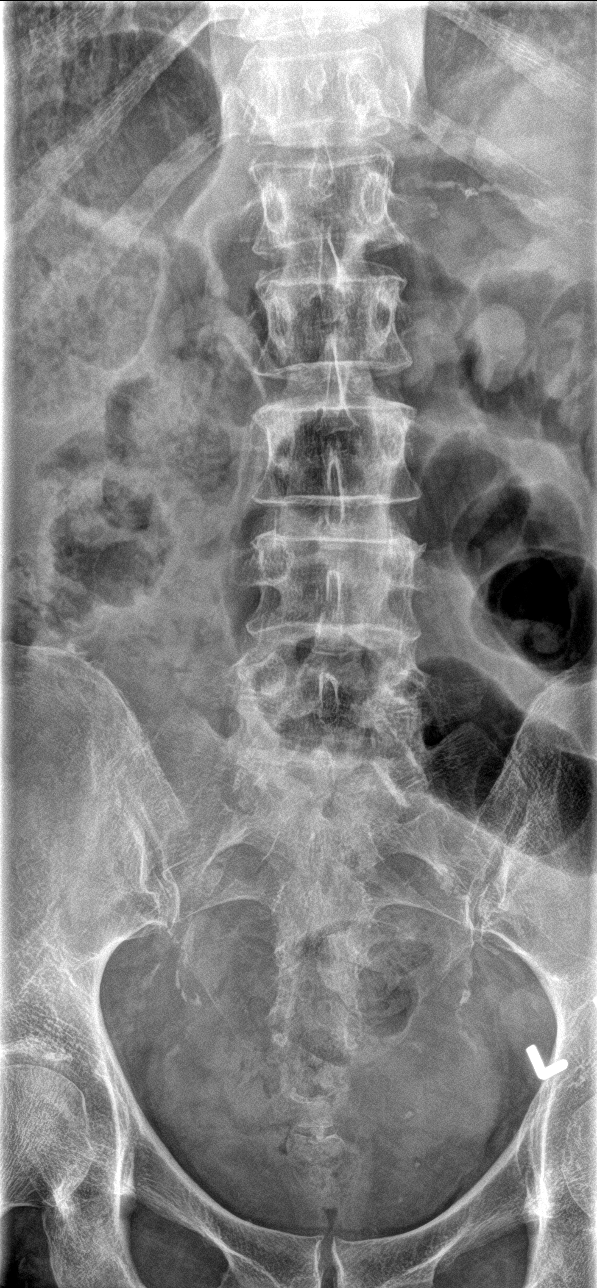
[im 2/5]
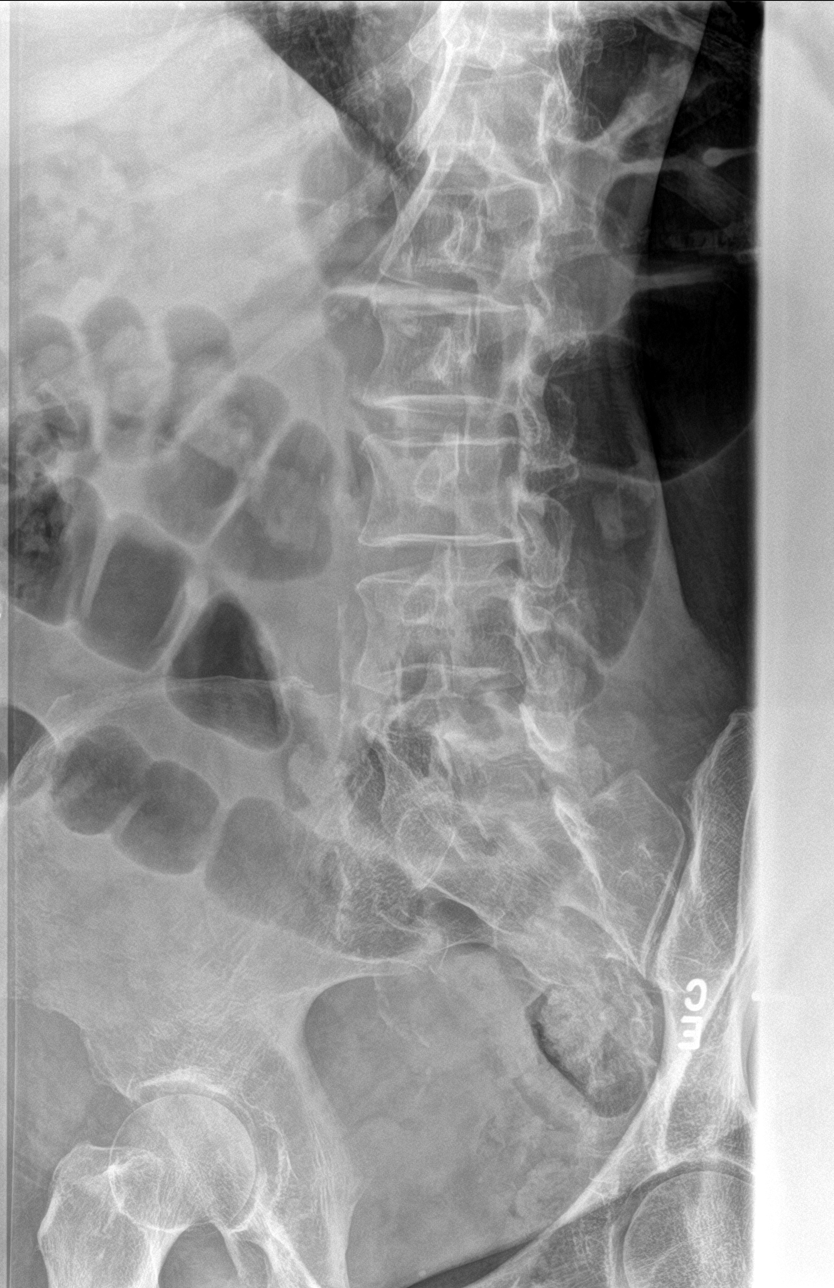
[im 3/5]
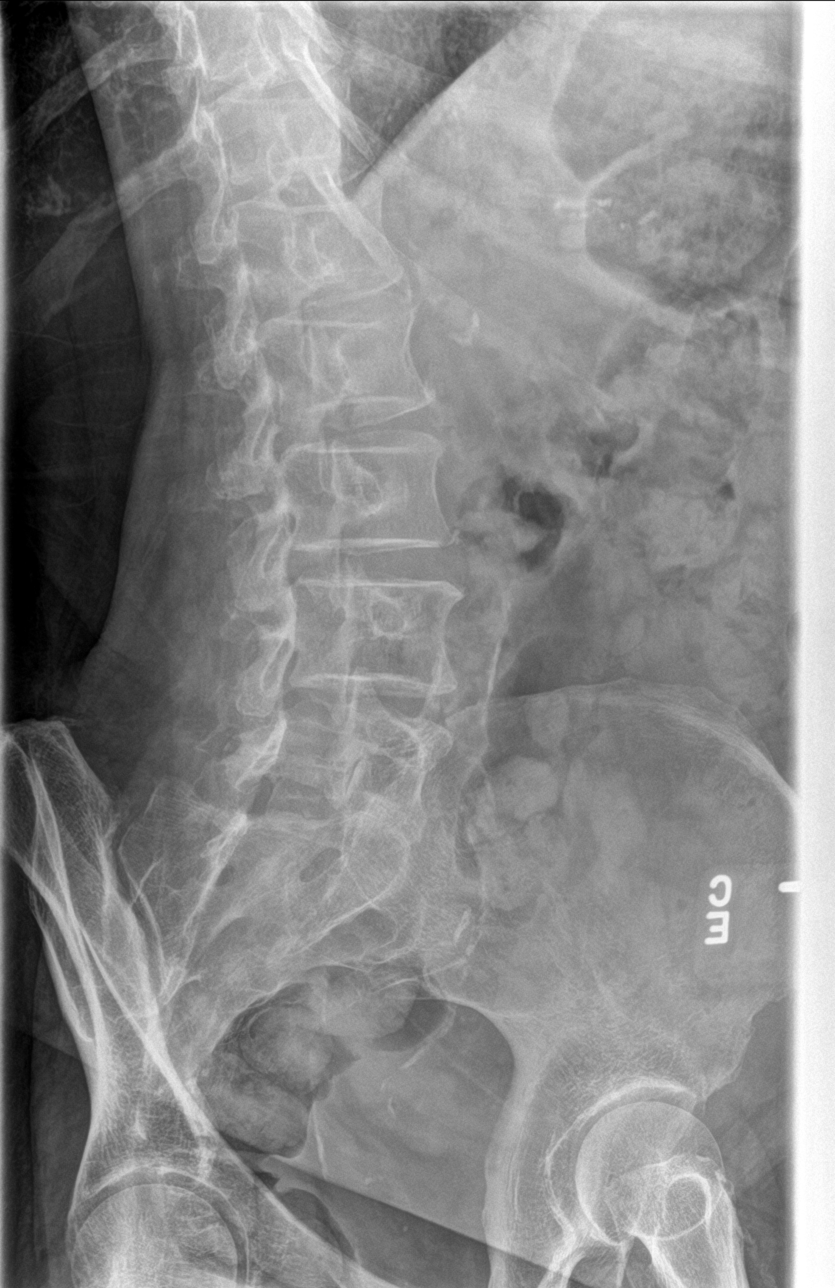
[im 4/5]
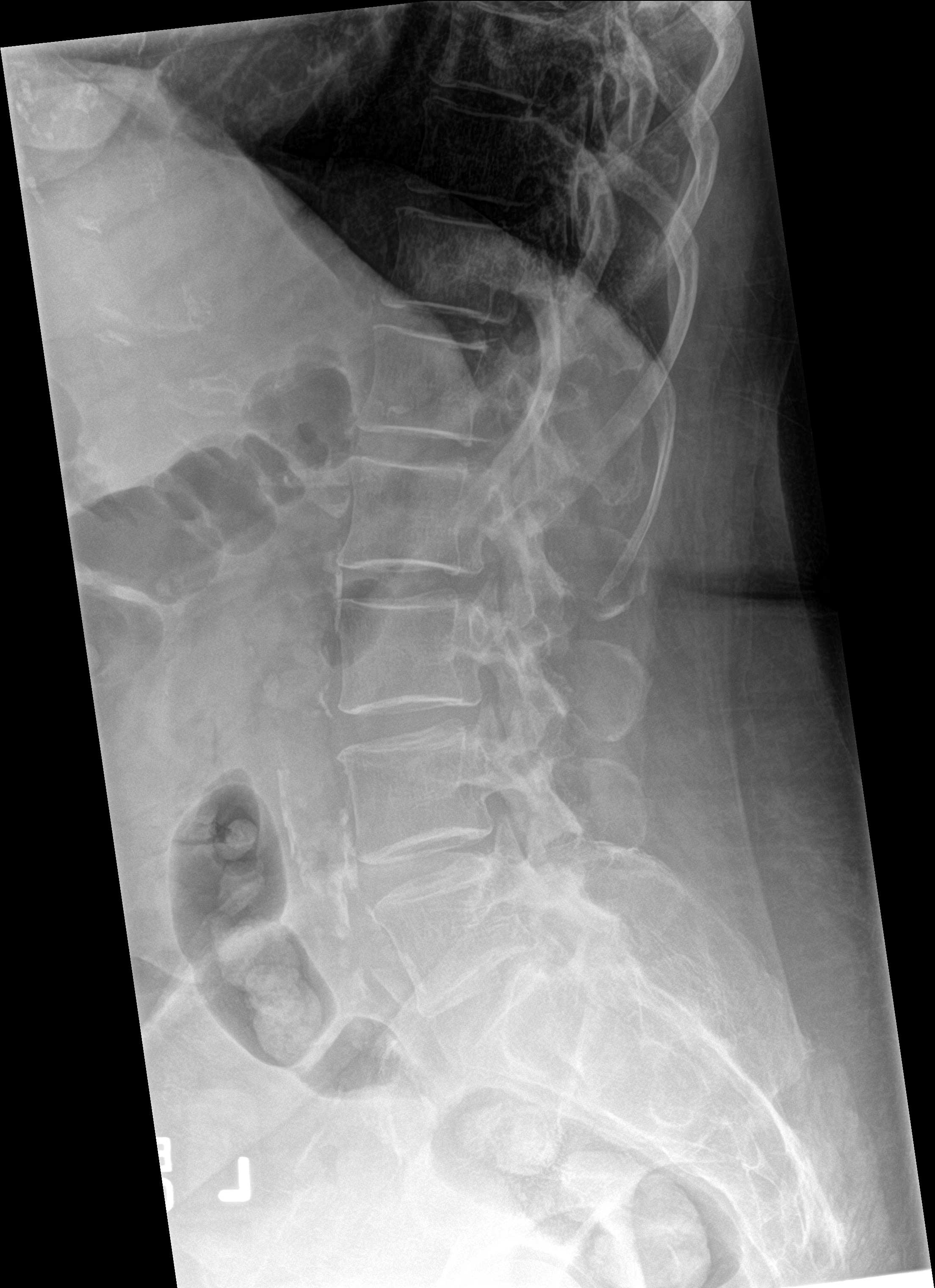
[im 5/5]
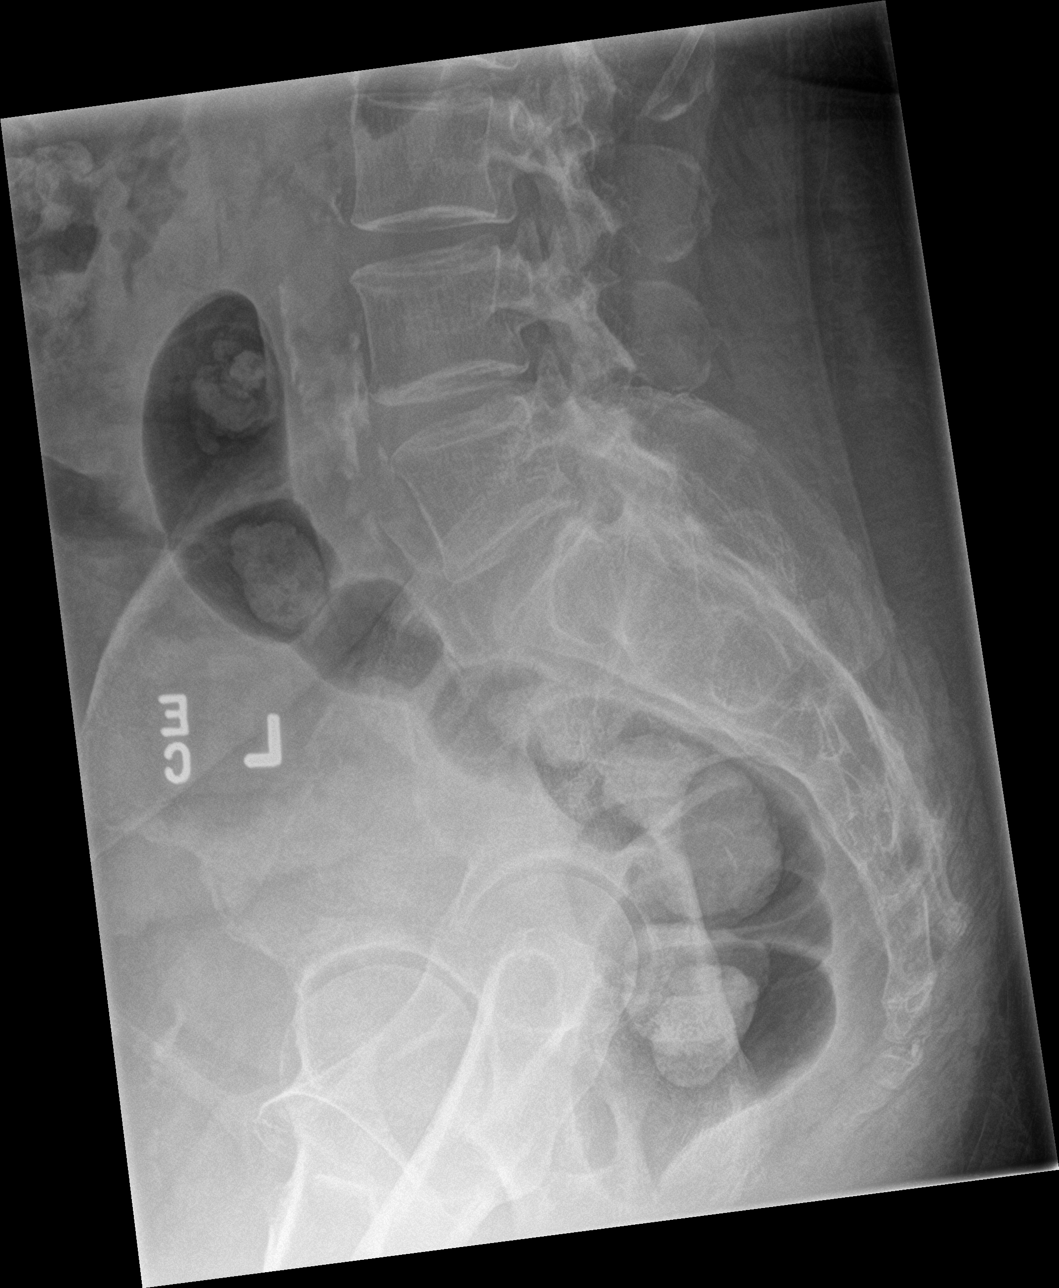

[5 of 5 positions shown; findings below may reference images not displayed]

FINDINGS: Five lumbar type vertebral bodies.

Normal lumbar lordosis.

No evidence of fracture or dislocation. Vertebral body heights and
intervertebral disc spaces are maintained.

Visualized bony pelvis appears intact.

Vascular calcifications.
IMPRESSION: Negative.

## 2022-09-04 ENCOUNTER — Emergency Department (HOSPITAL_COMMUNITY): Payer: 59

## 2022-09-04 ENCOUNTER — Inpatient Hospital Stay (HOSPITAL_COMMUNITY)
Admission: EM | Admit: 2022-09-04 | Discharge: 2022-10-02 | DRG: 963 | Disposition: E | Payer: 59 | Attending: Surgery | Admitting: Surgery

## 2022-09-04 DIAGNOSIS — G935 Compression of brain: Secondary | ICD-10-CM

## 2022-09-04 DIAGNOSIS — S020XXA Fracture of vault of skull, initial encounter for closed fracture: Secondary | ICD-10-CM

## 2022-09-04 DIAGNOSIS — W19XXXA Unspecified fall, initial encounter: Secondary | ICD-10-CM | POA: Diagnosis present

## 2022-09-04 DIAGNOSIS — R739 Hyperglycemia, unspecified: Secondary | ICD-10-CM

## 2022-09-04 DIAGNOSIS — S2232XA Fracture of one rib, left side, initial encounter for closed fracture: Secondary | ICD-10-CM

## 2022-09-04 DIAGNOSIS — S32009A Unspecified fracture of unspecified lumbar vertebra, initial encounter for closed fracture: Secondary | ICD-10-CM

## 2022-09-04 DIAGNOSIS — S065XAA Traumatic subdural hemorrhage with loss of consciousness status unknown, initial encounter: Principal | ICD-10-CM

## 2022-09-04 DIAGNOSIS — R402433 Glasgow coma scale score 3-8, at hospital admission: Secondary | ICD-10-CM | POA: Diagnosis present

## 2022-09-04 DIAGNOSIS — M4856XA Collapsed vertebra, not elsewhere classified, lumbar region, initial encounter for fracture: Secondary | ICD-10-CM | POA: Diagnosis present

## 2022-09-04 DIAGNOSIS — Z66 Do not resuscitate: Secondary | ICD-10-CM | POA: Diagnosis present

## 2022-09-04 DIAGNOSIS — I469 Cardiac arrest, cause unspecified: Secondary | ICD-10-CM | POA: Diagnosis not present

## 2022-09-04 DIAGNOSIS — Z515 Encounter for palliative care: Secondary | ICD-10-CM | POA: Diagnosis not present

## 2022-09-04 DIAGNOSIS — W109XXA Fall (on) (from) unspecified stairs and steps, initial encounter: Secondary | ICD-10-CM | POA: Diagnosis present

## 2022-09-04 DIAGNOSIS — S32592A Other specified fracture of left pubis, initial encounter for closed fracture: Secondary | ICD-10-CM | POA: Diagnosis present

## 2022-09-04 LAB — PROTIME-INR
INR: 1.1 (ref 0.8–1.2)
Prothrombin Time: 14.8 seconds (ref 11.4–15.2)

## 2022-09-04 LAB — CBG MONITORING, ED: Glucose-Capillary: 474 mg/dL — ABNORMAL HIGH (ref 70–99)

## 2022-09-04 LAB — CBC
HCT: 37.6 % (ref 36.0–46.0)
Hemoglobin: 13.1 g/dL (ref 12.0–15.0)
MCH: 33 pg (ref 26.0–34.0)
MCHC: 34.8 g/dL (ref 30.0–36.0)
MCV: 94.7 fL (ref 80.0–100.0)
Platelets: 247 10*3/uL (ref 150–400)
RBC: 3.97 MIL/uL (ref 3.87–5.11)
RDW: 11.9 % (ref 11.5–15.5)
WBC: 15.2 10*3/uL — ABNORMAL HIGH (ref 4.0–10.5)
nRBC: 0 % (ref 0.0–0.2)

## 2022-09-04 LAB — I-STAT ARTERIAL BLOOD GAS, ED
Acid-Base Excess: 2 mmol/L (ref 0.0–2.0)
Bicarbonate: 26.9 mmol/L (ref 20.0–28.0)
Calcium, Ion: 1.13 mmol/L — ABNORMAL LOW (ref 1.15–1.40)
HCT: 35 % — ABNORMAL LOW (ref 36.0–46.0)
Hemoglobin: 11.9 g/dL — ABNORMAL LOW (ref 12.0–15.0)
O2 Saturation: 100 %
Patient temperature: 97.6
Potassium: 4 mmol/L (ref 3.5–5.1)
Sodium: 131 mmol/L — ABNORMAL LOW (ref 135–145)
TCO2: 28 mmol/L (ref 22–32)
pCO2 arterial: 42.2 mmHg (ref 32–48)
pH, Arterial: 7.411 (ref 7.35–7.45)
pO2, Arterial: 353 mmHg — ABNORMAL HIGH (ref 83–108)

## 2022-09-04 LAB — I-STAT CHEM 8, ED
BUN: 12 mg/dL (ref 8–23)
Calcium, Ion: 1.07 mmol/L — ABNORMAL LOW (ref 1.15–1.40)
Chloride: 98 mmol/L (ref 98–111)
Creatinine, Ser: 0.8 mg/dL (ref 0.44–1.00)
Glucose, Bld: 400 mg/dL — ABNORMAL HIGH (ref 70–99)
HCT: 37 % (ref 36.0–46.0)
Hemoglobin: 12.6 g/dL (ref 12.0–15.0)
Potassium: 4.4 mmol/L (ref 3.5–5.1)
Sodium: 134 mmol/L — ABNORMAL LOW (ref 135–145)
TCO2: 28 mmol/L (ref 22–32)

## 2022-09-04 LAB — COMPREHENSIVE METABOLIC PANEL
ALT: 24 U/L (ref 0–44)
AST: 30 U/L (ref 15–41)
Albumin: 3.3 g/dL — ABNORMAL LOW (ref 3.5–5.0)
Alkaline Phosphatase: 98 U/L (ref 38–126)
Anion gap: 12 (ref 5–15)
BUN: 12 mg/dL (ref 8–23)
CO2: 23 mmol/L (ref 22–32)
Calcium: 8.6 mg/dL — ABNORMAL LOW (ref 8.9–10.3)
Chloride: 96 mmol/L — ABNORMAL LOW (ref 98–111)
Creatinine, Ser: 0.82 mg/dL (ref 0.44–1.00)
GFR, Estimated: >60 mL/min (ref >60)
Glucose, Bld: 399 mg/dL — ABNORMAL HIGH (ref 70–99)
Potassium: 4.3 mmol/L (ref 3.5–5.1)
Sodium: 131 mmol/L — ABNORMAL LOW (ref 135–145)
Total Bilirubin: 0.6 mg/dL (ref 0.3–1.2)
Total Protein: 6.6 g/dL (ref 6.5–8.1)

## 2022-09-04 LAB — SAMPLE TO BLOOD BANK

## 2022-09-04 LAB — HEMOGLOBIN A1C
Hgb A1c MFr Bld: 13.8 % — ABNORMAL HIGH (ref 4.8–5.6)
Mean Plasma Glucose: 349.36 mg/dL

## 2022-09-04 LAB — GLUCOSE, CAPILLARY
Glucose-Capillary: 328 mg/dL — ABNORMAL HIGH (ref 70–99)
Glucose-Capillary: 155 mg/dL — ABNORMAL HIGH (ref 70–99)
Glucose-Capillary: 174 mg/dL — ABNORMAL HIGH (ref 70–99)

## 2022-09-04 LAB — LACTIC ACID, PLASMA: Lactic Acid, Venous: 1.9 mmol/L (ref 0.5–1.9)

## 2022-09-04 LAB — MRSA NEXT GEN BY PCR, NASAL: MRSA by PCR Next Gen: NOT DETECTED

## 2022-09-04 LAB — HIV ANTIBODY (ROUTINE TESTING W REFLEX): HIV Screen 4th Generation wRfx: NONREACTIVE

## 2022-09-04 LAB — ETHANOL: Alcohol, Ethyl (B): <10 mg/dL (ref <10)

## 2022-09-04 MED ORDER — METHOCARBAMOL 500 MG PO TABS: 500.0000 mg | ORAL_TABLET | Freq: Three times a day (TID) | ORAL | Status: DC

## 2022-09-04 MED ORDER — INSULIN ASPART 100 UNIT/ML IJ SOLN: 0.0000 [IU] | Freq: Every day | INTRAMUSCULAR | Status: DC

## 2022-09-04 MED ORDER — ORAL CARE MOUTH RINSE
15.0000 mL | OROMUCOSAL | Status: DC
  Administered 2022-09-04 – 2022-09-08 (×42): 15 mL via OROMUCOSAL

## 2022-09-04 MED ORDER — ACETAMINOPHEN 500 MG PO TABS
1000.0000 mg | ORAL_TABLET | Freq: Four times a day (QID) | ORAL | Status: DC
  Administered 2022-09-04 – 2022-09-07 (×4): 1000 mg
  Filled 2022-09-04 (×3): qty 2

## 2022-09-04 MED ORDER — SODIUM CHLORIDE 0.9 % IV SOLN: INTRAVENOUS | Status: DC | PRN

## 2022-09-04 MED ORDER — METOPROLOL TARTRATE 5 MG/5ML IV SOLN: 5.0000 mg | Freq: Four times a day (QID) | INTRAVENOUS | Status: DC | PRN

## 2022-09-04 MED ORDER — ORAL CARE MOUTH RINSE
15.0000 mL | OROMUCOSAL | Status: DC
  Administered 2022-09-04: 15 mL via OROMUCOSAL

## 2022-09-04 MED ORDER — METHOCARBAMOL 1000 MG/10ML IJ SOLN
500.0000 mg | Freq: Three times a day (TID) | INTRAVENOUS | Status: DC
  Administered 2022-09-04: 500 mg via INTRAVENOUS
  Filled 2022-09-04: qty 500
  Filled 2022-09-04 (×3): qty 5

## 2022-09-04 MED ORDER — CHLORHEXIDINE GLUCONATE CLOTH 2 % EX PADS
6.0000 | MEDICATED_PAD | Freq: Every day | CUTANEOUS | Status: DC
  Administered 2022-09-04 – 2022-09-08 (×6): 6 via TOPICAL

## 2022-09-04 MED ORDER — IOHEXOL 350 MG/ML SOLN
75.0000 mL | Freq: Once | INTRAVENOUS | Status: AC | PRN
  Administered 2022-09-04: 75 mL via INTRAVENOUS

## 2022-09-04 MED ORDER — DOCUSATE SODIUM 50 MG/5ML PO LIQD
100.0000 mg | Freq: Two times a day (BID) | ORAL | Status: DC
  Administered 2022-09-04 – 2022-09-05 (×2): 100 mg
  Filled 2022-09-04 (×2): qty 10

## 2022-09-04 MED ORDER — ORAL CARE MOUTH RINSE: 15.0000 mL | OROMUCOSAL | Status: DC | PRN

## 2022-09-04 MED ORDER — LEVETIRACETAM IN NACL 500 MG/100ML IV SOLN
500.0000 mg | Freq: Two times a day (BID) | INTRAVENOUS | Status: DC
  Administered 2022-09-04 – 2022-09-05 (×3): 500 mg via INTRAVENOUS
  Filled 2022-09-04 (×4): qty 100

## 2022-09-04 MED ORDER — POLYETHYLENE GLYCOL 3350 17 G PO PACK: 17.0000 g | PACK | Freq: Every day | ORAL | Status: DC | PRN

## 2022-09-04 MED ORDER — INSULIN ASPART 100 UNIT/ML IJ SOLN
0.0000 [IU] | Freq: Three times a day (TID) | INTRAMUSCULAR | Status: DC
  Administered 2022-09-04: 15 [IU] via SUBCUTANEOUS
  Administered 2022-09-04: 11 [IU] via SUBCUTANEOUS
  Administered 2022-09-04: 3 [IU] via SUBCUTANEOUS
  Administered 2022-09-05 (×3): 8 [IU] via SUBCUTANEOUS

## 2022-09-04 MED ORDER — HYDRALAZINE HCL 20 MG/ML IJ SOLN
10.0000 mg | INTRAMUSCULAR | Status: DC | PRN
  Filled 2022-09-04: qty 1

## 2022-09-04 MED ORDER — ONDANSETRON 4 MG PO TBDP: 4.0000 mg | ORAL_TABLET | Freq: Four times a day (QID) | ORAL | Status: DC | PRN

## 2022-09-04 MED ORDER — LACTATED RINGERS IV SOLN: INTRAVENOUS | Status: DC

## 2022-09-04 MED ORDER — PROPOFOL 1000 MG/100ML IV EMUL
5.0000 ug/kg/min | INTRAVENOUS | Status: DC
  Administered 2022-09-04: 10 ug/kg/min via INTRAVENOUS

## 2022-09-04 MED ORDER — SUCCINYLCHOLINE CHLORIDE 20 MG/ML IJ SOLN
INTRAMUSCULAR | Status: AC | PRN
  Administered 2022-09-04: 100 mg via INTRAVENOUS

## 2022-09-04 MED ORDER — DOCUSATE SODIUM 100 MG PO CAPS: 100.0000 mg | ORAL_CAPSULE | Freq: Two times a day (BID) | ORAL | Status: DC

## 2022-09-04 MED ORDER — ACETAMINOPHEN 500 MG PO TABS
1000.0000 mg | ORAL_TABLET | Freq: Four times a day (QID) | ORAL | Status: DC
  Administered 2022-09-04 (×2): 1000 mg via ORAL
  Filled 2022-09-04 (×2): qty 2

## 2022-09-04 MED ORDER — METHOCARBAMOL 500 MG PO TABS
500.0000 mg | ORAL_TABLET | Freq: Three times a day (TID) | ORAL | Status: AC
  Administered 2022-09-04 – 2022-09-05 (×3): 500 mg
  Filled 2022-09-04 (×3): qty 1

## 2022-09-04 MED ORDER — ETOMIDATE 2 MG/ML IV SOLN
INTRAVENOUS | Status: AC | PRN
  Administered 2022-09-04: 20 mg via INTRAVENOUS

## 2022-09-04 MED ORDER — CLEVIDIPINE BUTYRATE 0.5 MG/ML IV EMUL
0.0000 mg/h | INTRAVENOUS | Status: DC
  Administered 2022-09-04: 2 mg/h via INTRAVENOUS

## 2022-09-04 MED ORDER — FENTANYL 2500MCG IN NS 250ML (10MCG/ML) PREMIX INFUSION: 0.0000 ug/h | INTRAVENOUS | Status: DC

## 2022-09-04 MED ORDER — METHOCARBAMOL 1000 MG/10ML IJ SOLN
500.0000 mg | Freq: Three times a day (TID) | INTRAVENOUS | Status: AC
  Administered 2022-09-06 – 2022-09-07 (×2): 500 mg via INTRAVENOUS
  Filled 2022-09-04 (×2): qty 500
  Filled 2022-09-04: qty 5

## 2022-09-04 MED ORDER — ONDANSETRON HCL 4 MG/2ML IJ SOLN: 4.0000 mg | Freq: Four times a day (QID) | INTRAMUSCULAR | Status: DC | PRN

## 2022-09-04 NOTE — Progress Notes (Signed)
Orthopedic Tech Progress Note Patient Details:  Leah Powell 03/03/1874 [id number] Level 1 Trauma. Not needed Patient ID: [name], female   DOB: 03/03/1874, 67 y.o.   MRN: [id number]  Leah Powell 09/04/2022, 7:14 AM

## 2022-09-04 NOTE — ED Notes (Signed)
Pt has decorticate posturing.  Neurosurgery is unaware.

## 2022-09-04 NOTE — ED Triage Notes (Addendum)
PT BIB [facility] as a Level 1 Trauma. Pt found on ground beside deck. Thought to have potentially  fallen down 4 steps. GCS 3. LKW 10p. Hematoma to back of head. Right pupil blown at 6. Left pupil 4.  173/93 90% RA, 98% 6L, HR 100

## 2022-09-04 NOTE — ED Notes (Signed)
Patient denies pain and is resting comfortably.  

## 2022-09-04 NOTE — Progress Notes (Signed)
Pt transported from ED Trauma B to CT and back to ED trauma B with no complications.

## 2022-09-04 NOTE — Progress Notes (Signed)
Family is here at bedside.  Notified Dr. Conchita Paris and Dr. Cliffton Asters.

## 2022-09-04 NOTE — Progress Notes (Signed)
Discussed with her son at bedside - he reports she and her estranged husband (his father) both live in his house with him.   Reviewed findings from her fall to date including injury burden. We discussed the impressions of our neurosurgical colleagues. Discussed potential for meaningful recovery being negligible. We worked to answer his questions. Plan for short term being supportive care. He expressed understanding of the above and agreement with the plan   Marin Olp, MD [facility]

## 2022-09-04 NOTE — ED Notes (Addendum)
Prop stopped and Fentanyl held until pt arrives to unit per Neurosurgery, Dr. Conchita Paris.

## 2022-09-04 NOTE — ED Notes (Signed)
Trauma Response Nurse Documentation   Leah Powell is a 67 y.o. female arriving to [facility] ED via EMS  On No antithrombotic that we know of. Trauma was activated as a Level 1 by ED Charge RN based on the following trauma criteria GCS < 9.  Patient cleared for CT by Dr. Cliffton Asters. Pt transported to CT with trauma response nurse present to monitor. RN remained with the patient throughout their absence from the department for clinical observation.   GCS 5.  History   No past medical history on file.   Initial Focused Assessment (If applicable, or please see trauma documentation): - Snoring respirations, not maintaining airway - Rhonchi heard in bilateral lungs - no c-collar in place - R pupil 7mm fixed, L pupil 5mm fixed both nonreactive - Bilateral 20G PIVs to ACs - CBG 393 for EMS - Pt decorticate posturing   CT's Completed:   CT Head, CT C-Spine, CT Chest w/ contrast, and CT abdomen/pelvis w/ contrast   Interventions:  - Intubated using RSI meds (7.5 ETT, 23 @ lip) - Propofol gtt initiated  - Cleviprex gtt initiated  - 35F OG tube placed  - trauma labs - 20G PIV to R wrist - Foley placed - ABG drawn - Working on finding family  - C-collar placed on arrival  - CXR - CT pan scan  Plan for disposition:  Admission to ICU   Consults completed:  Neurosurgeon at 0820 Dr Conchita Paris .  Event Summary: Pt was last seen around 10pm last night.  Pt was found at the bottom of stairs (? Outside) approx 4-5 feet by family this morning.  Pt unresponsive and snoring respirations.  Held propofol and fentanyl for best neuro exam.   Bedside handoff with ED RN Enid Cutter.    Janora Norlander  Trauma Response RN  Please call TRN at [phone] for further assistance.

## 2022-09-04 NOTE — H&P (Signed)
Activation and Reason: Level 1, fall down stairs, found this morning  Primary Survey:  Airway: secured by Dr. Particia Nearing on arrival Breathing: Bilateral bs Circulation: palpable pulses in all ext Disability: GCS 3T  HPI: Leah Powell is an 67 y.o. female whom by EMS report was found down this morning by family at bottom of stairs with apparent head trauma. Unresponsive. Arrived GCS 5 (E1 V1 M3) and was intubated by Dr. Particia Nearing.   PMH/PSH/Social/Family Hx - unknown 2/2 condition of patient, no family available at the moment for collateral nor contact information in her chart   No family history on file.  Social:  has no history on file for tobacco use, alcohol use, and drug use.  Allergies: Not on File  Medications: I have reviewed the patient's current medications.  Results for orders placed or performed during the hospital encounter of 09/04/22 (from the past 48 hour(s))  CBC     Status: Abnormal   Collection Time: 09/04/22  7:18 AM  Result Value Ref Range   WBC 15.2 (H) 4.0 - 10.5 K/uL   RBC 3.97 3.87 - 5.11 MIL/uL   Hemoglobin 13.1 12.0 - 15.0 g/dL   HCT 09.8 11.9 - 14.7 %   MCV 94.7 80.0 - 100.0 fL   MCH 33.0 26.0 - 34.0 pg   MCHC 34.8 30.0 - 36.0 g/dL   RDW 82.9 56.2 - 13.0 %   Platelets 247 150 - 400 K/uL   nRBC 0.0 0.0 - 0.2 %    Comment: Performed at [facility], [address] [place], [place] [address]  I-Stat Chem 8, ED (MC only)     Status: Abnormal   Collection Time: 09/04/22  7:23 AM  Result Value Ref Range   Sodium 134 (L) 135 - 145 mmol/L   Potassium 4.4 3.5 - 5.1 mmol/L   Chloride 98 98 - 111 mmol/L   BUN 12 8 - 23 mg/dL   Creatinine, Ser 4.69 0.44 - 1.00 mg/dL   Glucose, Bld 629 (H) 70 - 99 mg/dL    Comment: Glucose reference range applies only to samples taken after fasting for at least 8 hours.   Calcium, Ion 1.07 (L) 1.15 - 1.40 mmol/L   TCO2 28 22 - 32 mmol/L   Hemoglobin 12.6 12.0 - 15.0 g/dL   HCT 52.8 41.3 - 24.4 %    No  results found.  ROS -Unable to obtain due to condition of patient   PE There were no vitals taken for this visit. Physical Exam Constitutional: Intubated, unresponsive, scalp hematoma Eyes: Moist conjunctiva; dilated non-reactive pupils Neck: Trachea midline Lungs: CTAB CV: RRR; no pitting edema GI: Abd soft, nondistended MSK: No extremity deformities or ecchymoses Psychiatric: unable to assess 2/2 condition of patient  Results for orders placed or performed during the hospital encounter of 09/04/22 (from the past 48 hour(s))  CBC     Status: Abnormal   Collection Time: 09/04/22  7:18 AM  Result Value Ref Range   WBC 15.2 (H) 4.0 - 10.5 K/uL   RBC 3.97 3.87 - 5.11 MIL/uL   Hemoglobin 13.1 12.0 - 15.0 g/dL   HCT 01.0 27.2 - 53.6 %   MCV 94.7 80.0 - 100.0 fL   MCH 33.0 26.0 - 34.0 pg   MCHC 34.8 30.0 - 36.0 g/dL   RDW 64.4 03.4 - 74.2 %   Platelets 247 150 - 400 K/uL   nRBC 0.0 0.0 - 0.2 %    Comment: Performed at  [facility], [address] [place] [place], [place] [address]  I-Stat Chem 8, ED (MC only)     Status: Abnormal   Collection Time: 09/04/22  7:23 AM  Result Value Ref Range   Sodium 134 (L) 135 - 145 mmol/L   Potassium 4.4 3.5 - 5.1 mmol/L   Chloride 98 98 - 111 mmol/L   BUN 12 8 - 23 mg/dL   Creatinine, Ser 6.04 0.44 - 1.00 mg/dL   Glucose, Bld 540 (H) 70 - 99 mg/dL    Comment: Glucose reference range applies only to samples taken after fasting for at least 8 hours.   Calcium, Ion 1.07 (L) 1.15 - 1.40 mmol/L   TCO2 28 22 - 32 mmol/L   Hemoglobin 12.6 12.0 - 15.0 g/dL   HCT 98.1 19.1 - 47.8 %    No results found.    Assessment/Plan: 67yoF s/p apparent fall, found at base of stairs  Large R SDH with shift and uncal herniation - as per nsgy Dr. Conchita Paris - noted devastating head injury with negligible likelihood of meaningful recovery; supportive care for now; goals of care discussions moving forward L 6th rib fx - no ptx Multiple age indeterminate fxs  of spine - as per nsgy L pubic ramus fx, age indeterminate, given devastating head injury, will hold off on ortho eval Dispo - 4N ICU  I spent a total of 80 minutes in both face-to-face and non-face-to-face activities, excluding procedures performed, for this visit on the date of this encounter.  Marin Olp, MD [facility]

## 2022-09-04 NOTE — Consult Note (Addendum)
  Chief Complaint   Trauma code  History of Present Illness  Leah Powell is a 67 y.o. female brought into the emergency department via EMS after apparently being found at the base of about 4 stairs.  History is obtained through the EMR as the patient is unable to provide history and no family is available.  Unknown when she was last known well, however it appears she was last seen around 10 PM last night.  Per trauma personnel, patient had an initial GCS upon arrival to the emergency department 5 (E1 V1T M3).  CT scan revealed a large right subdural hematoma and neurosurgical consultation was requested.  Past Medical History  No past medical history on file. Unknown  Past Surgical History  Unknown  Social History  Unknown  Medications   Prior to Admission medications   Not on File    Allergies  Not on File  Review of Systems  ROS  Neurologic Exam  No eye opening to pain Right pupil 7mm non-reactive Left pupil 6mm non-reactive (-) corneal reflex (+) cough Minimal flicker of movement to central noxious pain  Repeat exam off propofol/fentanyl: No eye opening Bilateral fixed/dilated pupils (-) corneal BUE/BLE extensor posturing to noxious stim  Imaging  CT scan of the head was personally reviewed and demonstrates a large right-sided acute holohemispheric subdural hematoma with marked mass effect.  There is nearly 2 cm of right to left midline shift.  There is significant compression of the diencephalon.  There is no hydrocephalus.  Impression  - 67 y.o. female status post unknown trauma, likely fall with unknown medical history presenting with neurologic exam consistent with severe brain/brainstem injury including bilateral fixed and dilated pupils.  CT scan reveals large right subdural hematoma with significant associated brain/brainstem mass effect.  With the patient's presenting neurologic exam and CT findings I feel that the likelihood of any meaningful recovery is  negligible.  Plan  -Admit to ICU -SBP goal < -Would continue supportive care for now per trauma -Will need discussion regarding goals of care once family arrives   Lisbeth Renshaw, MD [facility]

## 2022-09-04 NOTE — ED Notes (Signed)
Working with PD, attempting to find family

## 2022-09-04 NOTE — ED Provider Notes (Signed)
[place] EMERGENCY DEPARTMENT AT [facility] Provider Note   CSN: [id number] Arrival date & time: 09/04/22  6295     History  No chief complaint on file.   Raiya Reyburn is a 67 y.o. female.  Pt is a 67 yo female with an unkn pmhx.  The story is a little unclear, but it sounds like she was last seen around 10 pm last night.  She was found at the bottom of stairs this morning.  Pt was not responding and EMS was called.  Level 1 trauma was called b/c she did have a hematoma to the back of her head.  Pt unable to give any hx.  Family is not here.       Home Medications Prior to Admission medications   Not on File      Allergies    Patient has no allergy information on record.    Review of Systems   Review of Systems  Unable to perform ROS: Patient unresponsive    Physical Exam Updated Vital Signs BP 113/68   Pulse (!) 103   Resp 18   Ht 5\' 7"  (1.702 m)   Wt 75 kg   SpO2 98% Comment: 6L  BMI 25.90 kg/m  Physical Exam Vitals and nursing note reviewed.  Constitutional:      General: She is in acute distress.  HENT:     Head: Normocephalic.     Comments: Hematoma to posterior head    Right Ear: External ear normal.     Left Ear: External ear normal.     Nose: Nose normal.     Mouth/Throat:     Mouth: Mucous membranes are dry.  Eyes:     Comments: Right pupil enlarged compared to left  Neck:     Comments: No step offs.  C- collar applied when pt arrived Cardiovascular:     Rate and Rhythm: Normal rate and regular rhythm.     Pulses: Normal pulses.     Heart sounds: Normal heart sounds.  Pulmonary:     Effort: Pulmonary effort is normal.     Breath sounds: Normal breath sounds.  Abdominal:     General: Abdomen is flat. Bowel sounds are normal.     Palpations: Abdomen is soft.  Musculoskeletal:        General: Normal range of motion.  Skin:    General: Skin is warm.     Capillary Refill: Capillary refill takes less than 2 seconds.   Neurological:     Mental Status: She is unresponsive.  Psychiatric:     Comments: Unable to asssess     ED Results / Procedures / Treatments   Labs (all labs ordered are listed, but only abnormal results are displayed) Labs Reviewed  COMPREHENSIVE METABOLIC PANEL - Abnormal; Notable for the following components:      Result Value   Sodium 131 (*)    Chloride 96 (*)    Glucose, Bld 399 (*)    Calcium 8.6 (*)    Albumin 3.3 (*)    All other components within normal limits  CBC - Abnormal; Notable for the following components:   WBC 15.2 (*)    All other components within normal limits  I-STAT CHEM 8, ED - Abnormal; Notable for the following components:   Sodium 134 (*)    Glucose, Bld 400 (*)    Calcium, Ion 1.07 (*)    All other components within normal limits  I-STAT ARTERIAL BLOOD GAS,  ED - Abnormal; Notable for the following components:   pO2, Arterial 353 (*)    Sodium 131 (*)    Calcium, Ion 1.13 (*)    HCT 35.0 (*)    Hemoglobin 11.9 (*)    All other components within normal limits  PROTIME-INR  LACTIC ACID, PLASMA  CDS SEROLOGY  ETHANOL  HEMOGLOBIN A1C  HIV ANTIBODY (ROUTINE TESTING W REFLEX)  SAMPLE TO BLOOD BANK    EKG EKG Interpretation Date/Time:  Thursday September 04 2022 07:46:29 EDT Ventricular Rate:  106 PR Interval:  154 QRS Duration:  100 QT Interval:  348 QTC Calculation: 463 R Axis:   -41  Text Interpretation: Sinus tachycardia Inferior infarct, old No old tracing to compare Confirmed by Jacalyn Lefevre [phone]) on 09/04/2022 7:49:38 AM  Radiology CT CHEST ABDOMEN PELVIS W CONTRAST  Result Date: 09/04/2022 CLINICAL DATA:  Level 1 trauma. EXAM: CT CHEST, ABDOMEN, AND PELVIS WITH CONTRAST TECHNIQUE: Multidetector CT imaging of the chest, abdomen and pelvis was performed following the standard protocol during bolus administration of intravenous contrast. RADIATION DOSE REDUCTION: This exam was performed according to the departmental dose-optimization  program which includes automated exposure control, adjustment of the mA and/or kV according to patient size and/or use of iterative reconstruction technique. CONTRAST:  75mL OMNIPAQUE IOHEXOL 350 MG/ML SOLN COMPARISON:  None Available. FINDINGS: CT CHEST FINDINGS Cardiovascular: Normal heart size. Aortic atherosclerosis and multi vessel coronary artery calcifications. There is no pericardial effusion. Mediastinum/Nodes: Tracheostomy tube tip is above the carina. There is an enteric tube with tip in the stomach. Thyroid gland, trachea and esophagus are unremarkable. No enlarged mediastinal or hilar lymph nodes. Lungs/Pleura: No significant pleural fluid, signs of pulmonary contusion or pneumothorax. Dependent changes within the posterior lung bases with possible early consolidation or atelectasis in the posterior right base. A few scattered patchy areas of ground-glass attenuation are noted which are nonspecific. No suspicious lung nodule or mass. Musculoskeletal: Nondisplaced fracture is noted involving the anterior aspect of the left 6 rib, image 111/4. There is very mild superior endplate deformity involving the T11 vertebra compatible with age indeterminate fracture deformity. There is also a superior endplate compression deformity involving L1 with loss of up to 25% of the vertebral body height. No significant retropulsion of fracture fragments noted. CT ABDOMEN PELVIS FINDINGS Hepatobiliary: No hepatic injury or perihepatic hematoma. Gallbladder is unremarkable. Pancreas: Unremarkable. No pancreatic ductal dilatation or surrounding inflammatory changes. Spleen: Normal in size without focal abnormality. Adrenals/Urinary Tract: No adrenal hemorrhage or renal injury identified. Bladder is unremarkable. Stomach/Bowel: Enteric tube tip is in the gastric fundus. Stomach is nondistended. No pathologic dilatation of the large or small bowel loops. Moderate retained stool noted in the colon. Vascular/Lymphatic: Aortic  atherosclerosis. No enlarged abdominal or pelvic lymph nodes. Reproductive: Uterus and bilateral adnexa are unremarkable. Other: No free fluid or fluid collections. No signs of pneumoperitoneum. Musculoskeletal: Increased sclerosis with suspected underlying chronic fracture involving the parasymphyseal left pubic rami, image 63/4 and image 67/6. IMPRESSION: 1. Nondisplaced fracture involving the anterior aspect of the left 6th rib. 2. Age indeterminate superior endplate compression deformities involving T11 and L1. Most severe at L1 with loss of approximately 25% of the vertebral body height. 3. No signs of pulmonary contusion or pneumothorax. 4. No signs of solid organ injury or free fluid in the abdomen or pelvis. 5. Chronic appearing fracture involving the parasymphyseal left pubic rami. 6. Aortic Atherosclerosis (ICD10-I70.0). Critical Value/emergent results were called by telephone at the time of interpretation on 09/04/2022  at 7:59 am to provider [facility] , who verbally acknowledged these results. Electronically Signed   By: Signa Kell M.D.   On: 09/04/2022 08:00   CT Head Wo Contrast  Result Date: 09/04/2022 CLINICAL DATA:  Head trauma EXAM: CT HEAD WITHOUT CONTRAST CT CERVICAL SPINE WITHOUT CONTRAST TECHNIQUE: Multidetector CT imaging of the head and cervical spine was performed following the standard protocol without intravenous contrast. Multiplanar CT image reconstructions of the cervical spine were also generated. RADIATION DOSE REDUCTION: This exam was performed according to the departmental dose-optimization program which includes automated exposure control, adjustment of the mA and/or kV according to patient size and/or use of iterative reconstruction technique. COMPARISON:  None Available. FINDINGS: CT HEAD FINDINGS Brain: Mixed density subdural hematoma from clotted and non clotted blood along the right cerebral convexity measuring up to 17 mm in thickness. Midline shift is pronounced  at 18 mm with right uncal herniation. Small volume parenchymal hemorrhage at the subcortical right frontal lobe with adjacent subarachnoid blood. The parenchymal clot measures 12 mm. There is some low-density in the cerebral white matter which is presumably chronic. No compressive infarct is noted. No hydrocephalus currently. Vascular: No hyperdense vessel or unexpected calcification. Skull: Roughly axially oriented calvarial fracture spanning the bilateral lower calvarium, see sagittal reformats, with mastoid involvement on the right to a limited degree causing slight pneumocephalus above the right mastoid air cells. Sinuses/Orbits: No acute finding CT CERVICAL SPINE FINDINGS Alignment: Normal. Skull base and vertebrae: No acute fracture. No primary bone lesion or focal pathologic process. Soft tissues and spinal canal: No prevertebral fluid or swelling. No visible canal hematoma. Disc levels: Degenerative endplate and facet spurring especially advanced at the right C3-4 facet. Upper chest: Reported separately Critical Value/emergent results were called by telephone at the time of interpretation on 09/04/2022 at 7:45 am to provider Dr. Cliffton Asters who is already aware of the large subdural hematoma IMPRESSION: 1. Large subdural hematoma along the right cerebral convexity with clotted and unclotted blood measuring up to 17 mm in thickness. Midline shift to a similar degree with right uncal herniation. 2. Small volume subarachnoid hemorrhage and parenchymal contusion at the lateral right frontal lobe. 3. Nondepressed calvarial fracture with right mastoid involvement causing trace pneumocephalus. 4. Negative for cervical spine fracture or subluxation. Electronically Signed   By: Tiburcio Pea M.D.   On: 09/04/2022 07:53   CT Cervical Spine Wo Contrast  Result Date: 09/04/2022 CLINICAL DATA:  Head trauma EXAM: CT HEAD WITHOUT CONTRAST CT CERVICAL SPINE WITHOUT CONTRAST TECHNIQUE: Multidetector CT imaging of the head and  cervical spine was performed following the standard protocol without intravenous contrast. Multiplanar CT image reconstructions of the cervical spine were also generated. RADIATION DOSE REDUCTION: This exam was performed according to the departmental dose-optimization program which includes automated exposure control, adjustment of the mA and/or kV according to patient size and/or use of iterative reconstruction technique. COMPARISON:  None Available. FINDINGS: CT HEAD FINDINGS Brain: Mixed density subdural hematoma from clotted and non clotted blood along the right cerebral convexity measuring up to 17 mm in thickness. Midline shift is pronounced at 18 mm with right uncal herniation. Small volume parenchymal hemorrhage at the subcortical right frontal lobe with adjacent subarachnoid blood. The parenchymal clot measures 12 mm. There is some low-density in the cerebral white matter which is presumably chronic. No compressive infarct is noted. No hydrocephalus currently. Vascular: No hyperdense vessel or unexpected calcification. Skull: Roughly axially oriented calvarial fracture spanning the bilateral lower calvarium, see sagittal  reformats, with mastoid involvement on the right to a limited degree causing slight pneumocephalus above the right mastoid air cells. Sinuses/Orbits: No acute finding CT CERVICAL SPINE FINDINGS Alignment: Normal. Skull base and vertebrae: No acute fracture. No primary bone lesion or focal pathologic process. Soft tissues and spinal canal: No prevertebral fluid or swelling. No visible canal hematoma. Disc levels: Degenerative endplate and facet spurring especially advanced at the right C3-4 facet. Upper chest: Reported separately Critical Value/emergent results were called by telephone at the time of interpretation on 09/04/2022 at 7:45 am to provider Dr. Cliffton Asters who is already aware of the large subdural hematoma IMPRESSION: 1. Large subdural hematoma along the right cerebral convexity with  clotted and unclotted blood measuring up to 17 mm in thickness. Midline shift to a similar degree with right uncal herniation. 2. Small volume subarachnoid hemorrhage and parenchymal contusion at the lateral right frontal lobe. 3. Nondepressed calvarial fracture with right mastoid involvement causing trace pneumocephalus. 4. Negative for cervical spine fracture or subluxation. Electronically Signed   By: Tiburcio Pea M.D.   On: 09/04/2022 07:53    Procedures Procedure Name: Intubation Date/Time: 09/04/2022 7:44 AM  Performed by: Jacalyn Lefevre, MDPre-anesthesia Checklist: Patient identified, Patient being monitored, Emergency Drugs available, Timeout performed and Suction available Oxygen Delivery Method: Non-rebreather mask Preoxygenation: Pre-oxygenation with 100% oxygen Induction Type: Rapid sequence Ventilation: Mask ventilation without difficulty Laryngoscope Size: Glidescope and 3 Tube size: 7.5 mm Number of attempts: 1 Placement Confirmation: ETT inserted through vocal cords under direct vision, CO2 detector and Breath sounds checked- equal and bilateral Secured at: 23 cm Tube secured with: ETT holder Dental Injury: Teeth and Oropharynx as per pre-operative assessment         Medications Ordered in ED Medications  etomidate (AMIDATE) injection (20 mg Intravenous Given 09/04/22 0710)  succinylcholine (ANECTINE) injection (100 mg Intravenous Given 09/04/22 0710)  insulin aspart (novoLOG) injection 0-15 Units (has no administration in time range)  insulin aspart (novoLOG) injection 0-5 Units (has no administration in time range)  acetaminophen (TYLENOL) tablet 1,000 mg (has no administration in time range)  methocarbamol (ROBAXIN) tablet 500 mg (has no administration in time range)    Or  methocarbamol (ROBAXIN) 500 mg in dextrose 5 % 50 mL IVPB (has no administration in time range)  docusate sodium (COLACE) capsule 100 mg (has no administration in time range)  polyethylene  glycol (MIRALAX / GLYCOLAX) packet 17 g (has no administration in time range)  ondansetron (ZOFRAN-ODT) disintegrating tablet 4 mg (has no administration in time range)    Or  ondansetron (ZOFRAN) injection 4 mg (has no administration in time range)  metoprolol tartrate (LOPRESSOR) injection 5 mg (has no administration in time range)  hydrALAZINE (APRESOLINE) injection 10 mg (has no administration in time range)  lactated ringers infusion (has no administration in time range)  levETIRAcetam (KEPPRA) IVPB 500 mg/100 mL premix (has no administration in time range)  Oral care mouth rinse (has no administration in time range)  Oral care mouth rinse (has no administration in time range)  clevidipine (CLEVIPREX) infusion 0.5 mg/mL (4 mg/hr Intravenous Infusion Verify 09/04/22 0806)  propofol (DIPRIVAN) 1000 MG/100ML infusion (10 mcg/kg/min  75 kg Intravenous Infusion Verify 09/04/22 0804)  fentaNYL in NS (64mcg/ml) infusion-PREMIX (has no administration in time range)  iohexol (OMNIPAQUE) 350 MG/ML injection 75 mL (75 mLs Intravenous Contrast Given 09/04/22 0739)    ED Course/ Medical Decision Making/ A&P  Medical Decision Making Amount and/or Complexity of Data Reviewed Labs: ordered. Radiology: ordered.  Risk Prescription drug management. Decision regarding hospitalization.   This patient presents to the ED for concern of ams, this involves an extensive number of treatment options, and is a complaint that carries with it a high risk of complications and morbidity.  The differential diagnosis includes multiple trauma   Co morbidities that complicate the patient evaluation  unknown   Additional history obtained:  Additional history obtained from epic chart review (nothing yet) External records from outside source obtained and reviewed including EMS report   Lab Tests:  I Ordered, and personally interpreted labs.  The pertinent results  include:  cbc with wbc elevated at 15.2, INR 1.1, cmp with bs elevated at 399   Imaging Studies ordered:  I ordered imaging studies including cxr, ct head/c-spine/chest/abd/pelvis I independently visualized and interpreted imaging which showed  CT head and ct c-spine: . Large subdural hematoma along the right cerebral convexity with  clotted and unclotted blood measuring up to 17 mm in thickness.  Midline shift to a similar degree with right uncal herniation.  2. Small volume subarachnoid hemorrhage and parenchymal contusion at  the lateral right frontal lobe.  3. Nondepressed calvarial fracture with right mastoid involvement  causing trace pneumocephalus.  4. Negative for cervical spine fracture or subluxation.   CT chest/abd/pelvis:  Nondisplaced fracture involving the anterior aspect of the left  6th rib.  2. Age indeterminate superior endplate compression deformities  involving T11 and L1. Most severe at L1 with loss of approximately  25% of the vertebral body height.  3. No signs of pulmonary contusion or pneumothorax.  4. No signs of solid organ injury or free fluid in the abdomen or  pelvis.  5. Chronic appearing fracture involving the parasymphyseal left  pubic rami.  6. Aortic Atherosclerosis (ICD10-I70.0).   I agree with the radiologist interpretation   Cardiac Monitoring:  The patient was maintained on a cardiac monitor.  I personally viewed and interpreted the cardiac monitored which showed an underlying rhythm of: st   Medicines ordered and prescription drug management:  I ordered medication including propofol  for sedation and bp  Reevaluation of the patient after these medicines showed that the patient improved I have reviewed the patients home medicines and have made adjustments as needed   Test Considered:  ct   Critical Interventions:  Level 1 trauma   Consultations Obtained:  I requested consultation with trauma (Dr. Cliffton Asters),  and discussed  lab and imaging findings as well as pertinent plan - he will admit and consult NS   Problem List / ED Course:  Fall:  C-collar placed upon arrival.  pt intubated in trauma bay using c-spine precautions as her GCS was 4 or 5 (EMS reported movement to pain, but I did not see if it was flexion or extension).  She was taken directly to the CT scanner from there.     Reevaluation:  After the interventions noted above, I reevaluated the patient and found that they have :stayed the same   Social Determinants of Health:  Lives at home   Dispostion:  After consideration of the diagnostic results and the patients response to treatment, I feel that the patent would benefit from admission.  CRITICAL CARE Performed by: Jacalyn Lefevre   Total critical care time: 30 minutes  Critical care time was exclusive of separately billable procedures and treating other patients.  Critical care was necessary to treat or prevent  imminent or life-threatening deterioration.  Critical care was time spent personally by me on the following activities: development of treatment plan with patient and/or surrogate as well as nursing, discussions with consultants, evaluation of patient's response to treatment, examination of patient, obtaining history from patient or surrogate, ordering and performing treatments and interventions, ordering and review of laboratory studies, ordering and review of radiographic studies, pulse oximetry and re-evaluation of patient's condition.           Final Clinical Impression(s) / ED Diagnoses Final diagnoses:  SDH (subdural hematoma) (HCC)  Uncal herniation (HCC)  Closed fracture of one rib of left side, initial encounter  Closed fracture of lumbar vertebral body (HCC)  Closed fracture of vault of skull, initial encounter (HCC)  Hyperglycemia    Rx / DC Orders ED Discharge Orders     None         Jacalyn Lefevre, MD 09/04/22 [id number]

## 2022-09-04 NOTE — TOC CAGE-AID Note (Signed)
Transition of Care [facility]) - CAGE-AID Screening   Patient Details  Name: Leah Powell MRN: [id number] Date of Birth: 08-15-1955  [facility] CM/SW Contact:    Janora Norlander, RN Phone Number: [phone] 09/04/2022, 6:01 PM   Clinical Narrative: Pt here after sustaining an unwitnessed fall.  Pt has a SDH and is intubated; therefore we are unable to complete screening.     CAGE-AID Screening: Substance Abuse Screening unable to be completed due to: : Patient unable to participate

## 2022-09-04 NOTE — Progress Notes (Signed)
Found son, Kennedy Bucker. [phone].  He went to wrong hospital but should be here soon.

## 2022-09-04 NOTE — Progress Notes (Signed)
Pt transported from ED Trauma B to 2H18 with RN at bedside with no complications.

## 2022-09-05 ENCOUNTER — Inpatient Hospital Stay (HOSPITAL_COMMUNITY): Payer: 59

## 2022-09-05 LAB — GLUCOSE, CAPILLARY
Glucose-Capillary: 393 mg/dL — ABNORMAL HIGH (ref 70–99)
Glucose-Capillary: 261 mg/dL — ABNORMAL HIGH (ref 70–99)
Glucose-Capillary: 275 mg/dL — ABNORMAL HIGH (ref 70–99)
Glucose-Capillary: 298 mg/dL — ABNORMAL HIGH (ref 70–99)
Glucose-Capillary: 295 mg/dL — ABNORMAL HIGH (ref 70–99)

## 2022-09-05 LAB — CBC
HCT: 30.1 % — ABNORMAL LOW (ref 36.0–46.0)
Hemoglobin: 10.6 g/dL — ABNORMAL LOW (ref 12.0–15.0)
MCH: 34.2 pg — ABNORMAL HIGH (ref 26.0–34.0)
MCHC: 35.2 g/dL (ref 30.0–36.0)
MCV: 97.1 fL (ref 80.0–100.0)
Platelets: 220 10*3/uL (ref 150–400)
RBC: 3.1 MIL/uL — ABNORMAL LOW (ref 3.87–5.11)
RDW: 12.2 % (ref 11.5–15.5)
WBC: 9.9 10*3/uL (ref 4.0–10.5)
nRBC: 0 % (ref 0.0–0.2)

## 2022-09-05 LAB — BASIC METABOLIC PANEL
Anion gap: 9 (ref 5–15)
BUN: 9 mg/dL (ref 8–23)
CO2: 25 mmol/L (ref 22–32)
Calcium: 8.3 mg/dL — ABNORMAL LOW (ref 8.9–10.3)
Chloride: 99 mmol/L (ref 98–111)
Creatinine, Ser: 0.65 mg/dL (ref 0.44–1.00)
GFR, Estimated: >60 mL/min (ref >60)
Glucose, Bld: 253 mg/dL — ABNORMAL HIGH (ref 70–99)
Potassium: 3.4 mmol/L — ABNORMAL LOW (ref 3.5–5.1)
Sodium: 133 mmol/L — ABNORMAL LOW (ref 135–145)

## 2022-09-05 LAB — TRIGLYCERIDES: Triglycerides: 101 mg/dL (ref <150)

## 2022-09-05 MED ORDER — FENTANYL CITRATE PF 50 MCG/ML IJ SOSY
50.0000 ug | PREFILLED_SYRINGE | Freq: Once | INTRAMUSCULAR | Status: AC
  Administered 2022-09-05: 50 ug via INTRAVENOUS
  Filled 2022-09-05: qty 1

## 2022-09-05 MED ORDER — METOPROLOL TARTRATE 5 MG/5ML IV SOLN: 5.0000 mg | Freq: Four times a day (QID) | INTRAVENOUS | Status: DC | PRN

## 2022-09-05 MED ORDER — MORPHINE SULFATE (PF) 2 MG/ML IV SOLN: 2.0000 mg | INTRAVENOUS | Status: DC | PRN

## 2022-09-05 MED ORDER — GLYCOPYRROLATE 0.2 MG/ML IJ SOLN
0.2000 mg | INTRAMUSCULAR | Status: DC | PRN
  Administered 2022-09-05 – 2022-09-08 (×6): 0.2 mg via INTRAVENOUS
  Filled 2022-09-05 (×6): qty 1

## 2022-09-05 MED ORDER — POLYVINYL ALCOHOL 1.4 % OP SOLN: 1.0000 [drp] | Freq: Four times a day (QID) | OPHTHALMIC | Status: DC | PRN

## 2022-09-05 MED ORDER — VITAL HIGH PROTEIN PO LIQD: 1000.0000 mL | ORAL | Status: DC

## 2022-09-05 MED ORDER — INSULIN ASPART 100 UNIT/ML IJ SOLN
0.0000 [IU] | INTRAMUSCULAR | Status: DC
  Administered 2022-09-06: 5 [IU] via SUBCUTANEOUS

## 2022-09-05 MED ORDER — ACETAMINOPHEN 650 MG RE SUPP
650.0000 mg | Freq: Four times a day (QID) | RECTAL | Status: DC | PRN
  Administered 2022-09-07 – 2022-09-08 (×4): 650 mg via RECTAL
  Filled 2022-09-05 (×6): qty 1

## 2022-09-05 MED ORDER — OXYCODONE HCL 5 MG PO TABS: 5.0000 mg | ORAL_TABLET | ORAL | Status: DC | PRN

## 2022-09-05 MED ORDER — GLYCOPYRROLATE 0.2 MG/ML IJ SOLN: 0.2000 mg | INTRAMUSCULAR | Status: DC | PRN

## 2022-09-05 MED ORDER — SODIUM CHLORIDE 0.9 % IV SOLN: INTRAVENOUS | Status: DC

## 2022-09-05 MED ORDER — VITAL AF 1.2 CAL PO LIQD
1000.0000 mL | ORAL | Status: DC
  Administered 2022-09-05: 1000 mL

## 2022-09-05 MED ORDER — GLYCOPYRROLATE 1 MG PO TABS: 1.0000 mg | ORAL_TABLET | ORAL | Status: DC | PRN

## 2022-09-05 MED ORDER — ACETAMINOPHEN 325 MG PO TABS: 650.0000 mg | ORAL_TABLET | Freq: Four times a day (QID) | ORAL | Status: DC | PRN

## 2022-09-05 MED ORDER — FENTANYL 2500MCG IN NS 250ML (10MCG/ML) PREMIX INFUSION
0.0000 ug/h | INTRAVENOUS | Status: DC
  Administered 2022-09-05: 50 ug/h via INTRAVENOUS
  Administered 2022-09-06 – 2022-09-08 (×3): 100 ug/h via INTRAVENOUS
  Filled 2022-09-05 (×7): qty 250

## 2022-09-05 MED ORDER — FENTANYL BOLUS VIA INFUSION
50.0000 ug | INTRAVENOUS | Status: DC | PRN
  Administered 2022-09-05 – 2022-09-08 (×4): 50 ug via INTRAVENOUS

## 2022-09-05 MED ORDER — PROSOURCE TF20 ENFIT COMPATIBL EN LIQD
60.0000 mL | Freq: Every day | ENTERAL | Status: DC
  Administered 2022-09-05: 60 mL
  Filled 2022-09-05: qty 60

## 2022-09-05 MED ORDER — MIDAZOLAM HCL 2 MG/2ML IJ SOLN: 1.0000 mg | INTRAMUSCULAR | Status: DC | PRN

## 2022-09-05 NOTE — TOC Initial Note (Signed)
Transition of Care [facility]) - Initial/Assessment Note    Patient Details  Name: Leah Powell MRN: [id number] Date of Birth: 09/29/1955  Transition of Care [facility]) CM/SW Contact:    Elliot Cousin, RN Phone Number: [phone] 09/05/2022, 3:50 PM  Clinical Narrative:   TOC CM spoke to pt's son. Son states they pt lives in the home with him. States he provides transportation to her appts. Pt may need IP rehab vs SNF rehab. Will continue to follow for dc needs.                 Expected Discharge Plan: IP Rehab Facility Barriers to Discharge: Continued Medical Work up   Patient Goals and CMS Choice Patient states their goals for this hospitalization and ongoing recovery are:: wants patient to recover          Expected Discharge Plan and Services   Discharge Planning Services: CM Consult   Living arrangements for the past 2 months: Single Family Home                                      Prior Living Arrangements/Services Living arrangements for the past 2 months: Single Family Home Lives with:: Adult Children, Relatives Patient language and need for interpreter reviewed:: Yes Do you feel safe going back to the place where you live?: Yes      Need for Family Participation in Patient Care: Yes (Comment) Care giver support system in place?: Yes (comment) Current home services: DME (rolling walker, cane, bedside commode) Criminal Activity/Legal Involvement Pertinent to Current Situation/Hospitalization: No - Comment as needed  Activities of Daily Living Home Assistive Devices/Equipment: None ADL Screening (condition at time of admission) Patient's cognitive ability adequate to safely complete daily activities?: No Is the patient deaf or have difficulty hearing?: No Does the patient have difficulty seeing, even when wearing glasses/contacts?: No Does the patient have difficulty concentrating, remembering, or making decisions?: No Patient able to express need for  assistance with ADLs?: No Does the patient have difficulty dressing or bathing?: No Independently performs ADLs?: No Communication: Dependent Is this a change from baseline?: Change from baseline, expected to last >3 days Dressing (OT): Dependent Is this a change from baseline?: Change from baseline, expected to last >3 days Grooming: Dependent Is this a change from baseline?: Change from baseline, expected to last >3 days Feeding: Dependent Is this a change from baseline?: Change from baseline, expected to last >3 days Bathing: Dependent Is this a change from baseline?: Change from baseline, expected to last >3 days Toileting: Dependent Is this a change from baseline?: Change from baseline, expected to last >3days In/Out Bed: Dependent Is this a change from baseline?: Change from baseline, expected to last >3 days Walks in Home: Dependent Is this a change from baseline?: Change from baseline, expected to last >3 days Does the patient have difficulty walking or climbing stairs?: Yes Weakness of Legs: Both Weakness of Arms/Hands: Both  Permission Sought/Granted Permission sought to share information with : Case Manager, Family Supports Permission granted to share information with : Yes, Verbal Permission Granted  Share Information with NAME: Leveda Caras     Permission granted to share info w Relationship: son  Permission granted to share info w Contact Information: [phone]  Emotional Assessment   Attitude/Demeanor/Rapport: Intubated (Following Commands or Not Following Commands)          Admission diagnosis:  Uncal  herniation (HCC) [G93.5] SDH (subdural hematoma) (HCC) [S06.5XAA] Hyperglycemia [R73.9] Fall, initial encounter [W19.XXXA] Closed fracture of vault of skull, initial encounter (HCC) [S02.0XXA] Closed fracture of one rib of left side, initial encounter [S22.32XA] Closed fracture of lumbar vertebral body (HCC) [S32.009A] Patient Active Problem List    Diagnosis Date Noted   Fall, initial encounter 09/24/2022   PCP:  Patient, No Pcp Per Pharmacy:   [facility] - [place], [place] - [address] [place] [address] Phone: [phone] Fax: [phone]     Social Determinants of Health (SDOH) Social History: SDOH Screenings   Housing: Patient Unable To Answer (09/03/2022)  Transportation Needs: Patient Unable To Answer (09/08/2022)   SDOH Interventions:     Readmission Risk Interventions     No data to display

## 2022-09-05 NOTE — Progress Notes (Signed)
  NEUROSURGERY PROGRESS NOTE   No issues overnight. Attempted SBT this am, failed within minutes.  EXAM:  BP (!) 157/78   Pulse 96   Temp 98.4 F (36.9 C) (Oral)   Resp (!) 25   Ht 5\' 7"  (1.702 m)   Wt 65.2 kg   SpO2 98%   BMI 22.51 kg/m   No eye opening to pain Right pupil 7mm non-reactive Left pupil 5mm non-reactive Appears to be flexion posturing UE, LLE  IMPRESSION:  67 y.o. female d#1 s/p fall with severe brain/brainstem injury. Cont to believe prognosis for meaningful neurologic recovery is poor.  PLAN: - Cont supportive care for now - Cont Keppra - Plan is for family discussion for goals of care later today   Lisbeth Renshaw, MD [facility]

## 2022-09-05 NOTE — Progress Notes (Signed)
RT NOTE: attempted SBT on patient this AM on CPAP/PSV of 15/5 at 0800.  Patient only took a couple of breaths and then became apneic and backup ventilation alarmed.  Patient placed back on full support ventilator settings and is tolerating well at this time.  Will continue to monitor.

## 2022-09-05 NOTE — Progress Notes (Signed)
Trauma/Critical Care Follow Up Note  Subjective:    Overnight Issues:   Objective:  Vital signs for last 24 hours: Temp:  [98.3 F (36.8 C)-100.7 F (38.2 C)] 100 F (37.8 C) (07/05 0400) Pulse Rate:  [92-111] 96 (07/05 0700) Resp:  [10-23] 15 (07/05 0700) BP: (96-165)/(55-105) 165/74 (07/05 0700) SpO2:  [96 %-100 %] 99 % (07/05 0700) FiO2 (%):  [40 %] 40 % (07/05 0400) Weight:  [65.2 kg] 65.2 kg (07/05 0600)  Hemodynamic parameters for last 24 hours:    Intake/Output from previous day: 07/04 0701 - 07/05 0700 In: 2898.8 [I.V.:2253.2; NG/GT:380; IV Piggyback:265.6] Out: 1610 [RUEAV:4098; Emesis/NG output:150]  Intake/Output this shift: No intake/output data recorded.  Vent settings for last 24 hours: Vent Mode: PRVC FiO2 (%):  [40 %] 40 % Set Rate:  [15 bmp] 15 bmp Vt Set:  [530 mL] 530 mL PEEP:  [5 cmH20] 5 cmH20 Plateau Pressure:  [14 cmH20-18 cmH20] 18 cmH20  Physical Exam:  Gen: comfortable, no distress Neuro: not following commands, moves BUE/LLE to painful stimulus HEENT: pupils dilated, nonreactive Neck: c-collar in place CV: RRR Pulm: unlabored breathing on mechanical ventilation Abd: soft, NT    GU: urine clear and yellow, +Foley Extr: wwp, no edema  Results for orders placed or performed during the hospital encounter of 09/12/2022 (from the past 24 hour(s))  Ethanol     Status: None   Collection Time: 09/10/2022  7:55 AM  Result Value Ref Range   Alcohol, Ethyl (B) <10 <10 mg/dL  I-Stat arterial blood gas, ED     Status: Abnormal   Collection Time: 09/14/2022  8:12 AM  Result Value Ref Range   pH, Arterial 7.411 7.35 - 7.45   pCO2 arterial 42.2 32 - 48 mmHg   pO2, Arterial 353 (H) 83 - 108 mmHg   Bicarbonate 26.9 20.0 - 28.0 mmol/L   TCO2 28 22 - 32 mmol/L   O2 Saturation 100 %   Acid-Base Excess 2.0 0.0 - 2.0 mmol/L   Sodium 131 (L) 135 - 145 mmol/L   Potassium 4.0 3.5 - 5.1 mmol/L   Calcium, Ion 1.13 (L) 1.15 - 1.40 mmol/L   HCT 35.0 (L)  36.0 - 46.0 %   Hemoglobin 11.9 (L) 12.0 - 15.0 g/dL   Patient temperature 11.9 F    Collection site RADIAL, ALLEN'S TEST ACCEPTABLE    Drawn by RT    Sample type ARTERIAL   CBG monitoring, ED     Status: Abnormal   Collection Time: 09/28/2022  8:39 AM  Result Value Ref Range   Glucose-Capillary 474 (H) 70 - 99 mg/dL  Hemoglobin J4N     Status: Abnormal   Collection Time: 09/23/2022  9:34 AM  Result Value Ref Range   Hgb A1c MFr Bld 13.8 (H) 4.8 - 5.6 %   Mean Plasma Glucose 349.36 mg/dL  HIV Antibody (routine testing w rflx)     Status: None   Collection Time: 09/03/2022  9:34 AM  Result Value Ref Range   HIV Screen 4th Generation wRfx Non Reactive Non Reactive  MRSA Next Gen by PCR, Nasal     Status: None   Collection Time: 09/11/2022 10:39 AM   Specimen: Nasal Mucosa; Nasal Swab  Result Value Ref Range   MRSA by PCR Next Gen NOT DETECTED NOT DETECTED  Glucose, capillary     Status: Abnormal   Collection Time: 09/05/2022 11:31 AM  Result Value Ref Range   Glucose-Capillary 328 (H) 70 - 99  mg/dL  Glucose, capillary     Status: Abnormal   Collection Time: 10/01/2022  3:22 PM  Result Value Ref Range   Glucose-Capillary 155 (H) 70 - 99 mg/dL  Glucose, capillary     Status: Abnormal   Collection Time: 09/07/2022  8:14 PM  Result Value Ref Range   Glucose-Capillary 174 (H) 70 - 99 mg/dL  Glucose, capillary     Status: Abnormal   Collection Time: 09/05/22  6:53 AM  Result Value Ref Range   Glucose-Capillary 261 (H) 70 - 99 mg/dL    Assessment & Plan: The plan of care was discussed with the bedside nurse for the day, who is in agreement with this plan and no additional concerns were raised.   Present on Admission:  Fall, initial encounter    LOS: 1 day   Additional comments:I reviewed the patient's new clinical lab test results.   and I reviewed the patients new imaging test results.    45F s/p apparent fall, found at base of stairs   Large R SDH with shift and uncal herniation -  as per nsgy Dr. Conchita Paris - noted devastating head injury with negligible likelihood of meaningful recovery; goal SBP<160, keppra x7d for sz ppx; supportive care for now; goals of care discussion today L 6th rib fx - no ptx Multiple age indeterminate fxs of spine - as per nsgy L pubic ramus fx, age indeterminate, given devastating head injury, will hold off on ortho eval Foley - keep FEN - tube feeding, d/c LR, change to NS until TF started DVT - SCDs, hold chemical ppx due to bleeding concerns Dispo - ICU   Critical Care Total Time: 35 minutes  Diamantina Monks, MD Trauma & General Surgery Please use [email or web address] to contact on call provider  09/05/2022  *Care during the described time interval was provided by me. I have reviewed this patient's available data, including medical history, events of note, physical examination and test results as part of my evaluation.

## 2022-09-05 NOTE — Progress Notes (Signed)
Initial Nutrition Assessment  DOCUMENTATION CODES:   Not applicable  INTERVENTION:   Tube Feeding via OG:  Vital AF 1.2 at 60 ml/hr Continue at 40 ml/hr, titrate by 10 mL q 8 hours until goal rate of 60 ml/hr TF regimen at goal provides 108 g of protein, 1728 kcals and 1094 mL of free water   NUTRITION DIAGNOSIS:   Inadequate oral intake related to acute illness as evidenced by NPO status.  GOAL:   Patient will meet greater than or equal to 90% of their needs   MONITOR:   Vent status, Labs, Weight trends, TF tolerance  REASON FOR ASSESSMENT:   Consult, Ventilator Enteral/tube feeding initiation and management  ASSESSMENT:   67 yo admitted after being found down by family at the bottom of stairs with apparent head trauma and found to have large R SDH with shift and uncal herniation in addition to multiple fx. Intubated on admission. PMH unknown  Pt remains on vent support, per neurosurgery, pt with devastating head injury with negligible likelihood of meaningful recovery. No sedation Pt remains Full Code at this time. Noted family meeting is planned  TF started this AM by MD via Adult TF protocol, Vital AF 1.2 at 40  Unable to obtain diet and weight history at this time  Labs: sodium 133 (L), potassium 3.4 (L), Creatinine wdl, CBGs 155-298 Meds: ss novolog, colace  Diet Order:   Diet Order             Diet NPO time specified  Diet effective now                   EDUCATION NEEDS:   Not appropriate for education at this time  Skin:  Skin Assessment: Reviewed RN Assessment  Last BM:  PTA  Height:   Ht Readings from Last 1 Encounters:  09/12/2022 5\' 7"  (1.702 m)    Weight:   Wt Readings from Last 1 Encounters:  09/05/22 65.2 kg   BMI:  Body mass index is 22.51 kg/m.  Estimated Nutritional Needs:   Kcal:  1700-1900 kcals  Protein:  90-105 g  Fluid:  >/= 1.7 L  Romelle Starcher MS, RDN, LDN, CNSC Registered Dietitian 3 Clinical  Nutrition RD Pager and On-Call Pager Number Located in [place]

## 2022-09-05 NOTE — Progress Notes (Signed)
Clinical update provided to son at bedside. Husband unavailable by phone today due to chemo treatment, contact information entered into the computer. Will attempt to reach in AM.   Diamantina Monks, MD General and Trauma Surgery [facility] Surgery

## 2022-09-05 NOTE — Progress Notes (Signed)
Clinical update provided to spouse via phone. He accepts decision-maker status. He states clearly that current care would not be line with her wishes and elects for compassionate extubation. A cousin, Kenney Houseman, is at home with him and would like to be present for extubation as well as the patient's son who is in the building. Will plan for extubation when both are at bedside. Code status changed to DNR, comfort measures ordered.   Critical care time:  Diamantina Monks, MD General and Trauma Surgery [facility]

## 2022-09-05 NOTE — Progress Notes (Signed)
Pt extubated per order for comfort care. Pt tol well. Pt placed on [facility] and family brought to bedside.

## 2022-09-05 NOTE — Inpatient Diabetes Management (Signed)
Inpatient Diabetes Program Recommendations  AACE/ADA: New Consensus Statement on Inpatient Glycemic Control (2015)  Target Ranges:  Prepandial:   less than 140 mg/dL      Peak postprandial:   less than 180 mg/dL (1-2 hours)      Critically ill patients:  140 - 180 mg/dL   Lab Results  Component Value Date   GLUCAP 275 (H) 09/05/2022   HGBA1C 13.8 (H) 09/11/2022    Review of Glycemic Control  Diabetes history: type 2 Outpatient Diabetes medications: Soliqua dose?, Metformin 1000 mg BID Current orders for Inpatient glycemic control: Novolog 0-15 units correction scale TID, Novolog 0-5 units HS scale  Inpatient Diabetes Program Recommendations:   Noted that patient is on continuous tube feedings and intubated.  Recommend changing Novolog 0-15 units correction scale to every 4 hours while NPO and discontinuing Novolog 0-5 units HS scale.  Smith Mince RN BSN CDE Diabetes Coordinator Pager: [phone]  8am-5pm

## 2022-09-06 LAB — GLUCOSE, CAPILLARY
Glucose-Capillary: 224 mg/dL — ABNORMAL HIGH (ref 70–99)
Glucose-Capillary: 216 mg/dL — ABNORMAL HIGH (ref 70–99)

## 2022-09-06 NOTE — Progress Notes (Signed)
Trauma/Critical Care Follow Up Note  Subjective:    Overnight Issues:   Objective:  Vital signs for last 24 hours: Temp:  [98.4 F (36.9 C)-99.2 F (37.3 C)] 99.2 F (37.3 C) (07/05 1600) Pulse Rate:  [93-116] 114 (07/06 0400) Resp:  [14-30] 28 (07/06 0400) BP: (112-168)/(66-86) 168/75 (07/05 1900) SpO2:  [96 %-100 %] 97 % (07/06 0400) FiO2 (%):  [40 %] 40 % (07/05 1440)  Hemodynamic parameters for last 24 hours:    Intake/Output from previous day: 07/05 0701 - 07/06 0700 In: 1050.7 [I.V.:357.4; NG/GT:593.3; IV Piggyback:100] Out: 2090 [Urine:2090]  Intake/Output this shift: No intake/output data recorded.  Vent settings for last 24 hours: Vent Mode: PRVC FiO2 (%):  [40 %] 40 % Set Rate:  [15 bmp] 15 bmp Vt Set:  [530 mL] 530 mL PEEP:  [5 cmH20] 5 cmH20 Plateau Pressure:  [17 cmH20] 17 cmH20  Physical Exam:  Gen: comfortable, no distress Neck: supple CV: RRR Pulm: unlabored breathing on  Abd: soft, NT    GU: urine clear and yellow, +Foley Extr: wwp, no edema  Results for orders placed or performed during the hospital encounter of 09/14/2022 (from the past 24 hour(s))  CBC     Status: Abnormal   Collection Time: 09/05/22  8:00 AM  Result Value Ref Range   WBC 9.9 4.0 - 10.5 K/uL   RBC 3.10 (L) 3.87 - 5.11 MIL/uL   Hemoglobin 10.6 (L) 12.0 - 15.0 g/dL   HCT 16.1 (L) 09.6 - 04.5 %   MCV 97.1 80.0 - 100.0 fL   MCH 34.2 (H) 26.0 - 34.0 pg   MCHC 35.2 30.0 - 36.0 g/dL   RDW 40.9 81.1 - 91.4 %   Platelets 220 150 - 400 K/uL   nRBC 0.0 0.0 - 0.2 %  Basic metabolic panel     Status: Abnormal   Collection Time: 09/05/22  8:00 AM  Result Value Ref Range   Sodium 133 (L) 135 - 145 mmol/L   Potassium 3.4 (L) 3.5 - 5.1 mmol/L   Chloride 99 98 - 111 mmol/L   CO2 25 22 - 32 mmol/L   Glucose, Bld 253 (H) 70 - 99 mg/dL   BUN 9 8 - 23 mg/dL   Creatinine, Ser 7.82 0.44 - 1.00 mg/dL   Calcium 8.3 (L) 8.9 - 10.3 mg/dL   GFR, Estimated >95 >62 mL/min   Anion gap 9  5 - 15  Triglycerides     Status: None   Collection Time: 09/05/22  8:00 AM  Result Value Ref Range   Triglycerides 101 <150 mg/dL  Glucose, capillary     Status: Abnormal   Collection Time: 09/05/22 10:59 AM  Result Value Ref Range   Glucose-Capillary 275 (H) 70 - 99 mg/dL  Glucose, capillary     Status: Abnormal   Collection Time: 09/05/22  1:00 PM  Result Value Ref Range   Glucose-Capillary 298 (H) 70 - 99 mg/dL   Comment 1 Notify RN    Comment 2 Document in Chart   Glucose, capillary     Status: Abnormal   Collection Time: 09/05/22  4:00 PM  Result Value Ref Range   Glucose-Capillary 295 (H) 70 - 99 mg/dL    Assessment & Plan: The plan of care was discussed with the bedside nurse for the day, who is in agreement with this plan and no additional concerns were raised.   Present on Admission:  Fall, initial encounter    LOS: 2 days  Additional comments:None  Leah Powell s/p apparent fall, found at base of stairs   Large R SDH with shift and uncal herniation - as per nsgy Dr. Conchita Paris - noted devastating head injury with negligible likelihood of meaningful recovery; transitioned to comfort care with compassionate extubation yest L 6th rib fx - no ptx Multiple age indeterminate fxs of spine - as per nsgy L pubic ramus fx, age indeterminate, given devastating head injury, will hold off on ortho eval Foley - keep FEN - d/c tube feeding DVT - SCDs, hold chemical ppx due to bleeding concerns Dispo - ICU   Critical Care Total Time: 35 minutes  Diamantina Monks, MD Trauma & General Surgery Please use [email or web address] to contact on call provider  09/06/2022  *Care during the described time interval was provided by me. I have reviewed this patient's available data, including medical history, events of note, physical examination and test results as part of my evaluation.

## 2022-09-07 NOTE — Plan of Care (Signed)

## 2022-09-07 NOTE — Progress Notes (Signed)
   Trauma/Critical Care Follow Up Note  Subjective:    Overnight Issues:   Objective:  Vital signs for last 24 hours: Temp:  [100.1 F (37.8 C)] 100.1 F (37.8 C) (07/07 0629) Pulse Rate:  [110] 110 (07/07 0629) Resp:  [24] 24 (07/07 0629) BP: (133)/(61) 133/61 (07/07 0629) SpO2:  [87 %] 87 % (07/07 0629) Weight:  [65.2 kg] 65.2 kg (07/07 0500)  Hemodynamic parameters for last 24 hours:    Intake/Output from previous day: 07/06 0701 - 07/07 0700 In: 235.9 [I.V.:180.9; IV Piggyback:55] Out: 500 [Urine:500]  Intake/Output this shift: No intake/output data recorded.  Vent settings for last 24 hours:    Physical Exam:  Gen: comfortable, no distress Neuro: not following commands HEENT: PERRL Neck: supple CV: RRR Pulm: congested breathing on RA Abd: soft, NT    GU: urine clear and yellow, +spontaneous voids Extr: wwp, no edema  Results for orders placed or performed during the hospital encounter of 09/19/2022 (from the past 24 hour(s))  Glucose, capillary     Status: Abnormal   Collection Time: 09/06/22  2:14 PM  Result Value Ref Range   Glucose-Capillary 216 (H) 70 - 99 mg/dL   Comment 1 Notify RN     Assessment & Plan:  Present on Admission:  Fall, initial encounter    LOS: 3 days   Additional comments:I reviewed the patient's new clinical lab test results.   and I reviewed the patients new imaging test results.    31F s/p apparent fall, found at base of stairs   Large R SDH with shift and uncal herniation - as per nsgy Dr. Conchita Paris - noted devastating head injury with negligible likelihood of meaningful recovery; transitioned to comfort care with compassionate extubation yest L 6th rib fx - no ptx Multiple age indeterminate fxs of spine - as per nsgy L pubic ramus fx, age indeterminate, given devastating head injury, will hold off on ortho eval Foley - keep FEN - d/c tube feeding DVT - SCDs Dispo - 6N, comfort care  Diamantina Monks, MD Trauma &  General Surgery Please use [email or web address] to contact on call provider  09/07/2022  *Care during the described time interval was provided by me. I have reviewed this patient's available data, including medical history, events of note, physical examination and test results as part of my evaluation.

## 2022-09-08 NOTE — Progress Notes (Signed)
Went back and spoke to the niece this afternoon.  Patient has a bit more labored breathing this afternoon than this morning.  Niece had some questions concerning possible hospice placement.  I have written for a palliative care consult to assist with placement; however, we discussed that if her overall status worsens and she begins the active dying process, we would not look at transporting the patient, which the niece agreed with.  This morning the patient looked comfortable and no signs of active dying.  This afternoon her WOB has increased so we will see how tonight goes.  Letha Cape 3:19 PM 09/08/2022

## 2022-09-08 NOTE — Plan of Care (Signed)
  Problem: Education: Goal: Knowledge of the prescribed therapeutic regimen will improve Outcome: Progressing   Problem: Coping: Goal: Ability to identify and develop effective coping behavior will improve Outcome: Progressing   Problem: Respiratory: Goal: Verbalizations of increased ease of respirations will increase Outcome: Progressing   Problem: Pain Management: Goal: Satisfaction with pain management regimen will improve Outcome: Progressing

## 2022-09-08 NOTE — Progress Notes (Signed)
   Trauma/Critical Care Follow Up Note  Subjective:    Overnight Issues: sleeping, did not arouse.  No family present  Objective:  Vital signs for last 24 hours: Temp:  [99.8 F (37.7 C)-101 F (38.3 C)] 99.8 F (37.7 C) (07/08 0737) Pulse Rate:  [122-128] 126 (07/08 0737) Resp:  [22-24] 22 (07/08 0737) BP: (96-161)/(56-77) 161/77 (07/08 0737) SpO2:  [90 %-96 %] 96 % (07/08 0737) Weight:  [65.1 kg] 65.1 kg (07/08 0500)   Intake/Output from previous day: 07/07 0701 - 07/08 0700 In: 421.2 [I.V.:421.2] Out: 1350 [Urine:1350]  Intake/Output this shift: No intake/output data recorded.   Physical Exam:  Gen: comfortable, no distress Neuro: sleeping, did not try to arouse CV: tachy Pulm: congested breathing on RA  No results found for this or any previous visit (from the past 24 hour(s)).   Assessment & Plan:  Present on Admission:  Fall, initial encounter    LOS: 4 days   Additional comments: I have reviewed the patient's vitals and chart  22F s/p apparent fall, found at base of stairs   Large R SDH with shift and uncal herniation - as per nsgy Dr. Conchita Paris - noted devastating head injury with negligible likelihood of meaningful recovery; transitioned to comfort care with compassionate extubation yest L 6th rib fx - no ptx Multiple age indeterminate fxs of spine - as per nsgy L pubic ramus fx, age indeterminate, given devastating head injury, will hold off on ortho eval Foley - keep FEN - d/c tube feeding DVT - SCDs Dispo - 6N, comfort care  Letha Cape, PA-C Trauma & General Surgery Please use [email or web address] to contact on call provider  09/08/2022  *Care during the described time interval was provided by me. I have reviewed this patient's available data, including medical history, events of note, physical examination and test results as part of my evaluation.

## 2022-09-08 NOTE — Progress Notes (Signed)
   09/08/22 1501  Spiritual Encounters  Type of Visit Initial  Care provided to: Pt and family  Conversation partners present during encounter Nurse  Reason for visit Routine spiritual support  OnCall Visit No   Chaplain responded to Spiritual Consult of family seeking chaplain to pray for PT before she passes.  Arrived in the room and met PT's relative Kenney Houseman and spoke with her for awhile.  Tonya and RN had a brief discussion while I was there as well. Kenney Houseman described a troublesome interaction with a doctor and chaplain worked to regain families confidence.  Tanya expressed that the RN care on the floor has been fabulous.  Chaplain was asked to pray for PT.  Chaplain took PT's hand and prayed.  Chaplain instructed family to have RN call Spiritual Care if they would like a chaplain to return.

## 2022-09-08 NOTE — Care Management Important Message (Signed)
Important Message  Patient Details  Name: Leah Powell MRN: [id number] Date of Birth: [birth year]   Medicare Important Message Given:  Yes     Sherilyn Banker 09/08/2022, 3:50 PM

## 2022-09-08 NOTE — Progress Notes (Signed)
Nutrition Brief Note  Chart reviewed. Pt has now transitioned to comfort care.  No further nutrition interventions planned at this time.  Please re-consult RD as needed.   Letta Median, MS, RD, LDN, CNSC Pager number available on Amion

## 2022-10-02 NOTE — Progress Notes (Signed)
Leah Adu, MD notified of patients death at 42. Two nurses verified patients time of death. Leah Powell and Leah Dowse, RN. Spouse Sarahgrace Broman was notified of patients death and did not want to come and visit before taking her to the morgue. Patient had no belongings at bedside.

## 2022-10-02 NOTE — Discharge Summary (Signed)
DEATH SUMMARY   Patient Details  Name: Leah Powell MRN: [id number] DOB: 05/31/1955 [id number], No Pcp Per  Admission/Discharge Information   Admit Date:  10-04-22  Date of Death: Date of Death: 09-Oct-2022  Time of Death: Time of Death: Oct 13, 2037  Length of Stay: 5   Principle Cause of death: cardiopulmonary arrest  Hospital Diagnoses: Principal Problem:   Fall, initial encounter   Hospital Course: The patient was admitted after a fall down stairs with a devastating brain injury.  The decision was made by the family to transition to comfort care.  The patient had an inhospital death.  Assessment and Plan: 71F s/p apparent fall, found at base of stairs Large R SDH with shift and uncal herniation - as per nsgy Dr. Conchita Paris - noted devastating head injury with negligible likelihood of meaningful recovery; transitioned to comfort care with compassionate extubation yest L 6th rib fx - no ptx Multiple age indeterminate fxs of spine - as per nsgy L pubic ramus fx, age indeterminate, given devastating head injury, will hold off on ortho eval   Procedures: none  Consultations: Dr. Conchita Paris, NSGY  The results of significant diagnostics from this hospitalization (including imaging, microbiology, ancillary and laboratory) are listed below for reference.   Significant Diagnostic Studies: DG CHEST PORT 1 VIEW  Result Date: 09/05/2022 CLINICAL DATA:  Status post ET tube placement EXAM: PORTABLE CHEST 1 VIEW COMPARISON:  CT 10/04/2022 FINDINGS: Interval placement of endotracheal tube with tip 3.2 cm above the carina. Enteric tube tip is in the stomach. Heart size and mediastinal contours are unremarkable. IMPRESSION: Endotracheal tube tip 3.2 cm above the carina. The enteric tube tip is in the stomach. Electronically Signed   By: Signa Kell M.D.   On: 09/05/2022 06:23   DG Chest Portable 1 View  Result Date: October 04, 2022 CLINICAL DATA:  Trauma. EXAM: PORTABLE CHEST 1 VIEW COMPARISON:   CT from earlier today FINDINGS: The endotracheal tube tip is 3 cm above the carina. Enteric tube tip is in the stomach. Heart size and mediastinal contours are normal. No pleural fluid or interstitial edema. No airspace consolidation. IMPRESSION: Support apparatus is in good position. No acute cardiopulmonary disease. Electronically Signed   By: Signa Kell M.D.   On: 2022-10-04 08:43   CT CHEST ABDOMEN PELVIS W CONTRAST  Result Date: 10/04/22 CLINICAL DATA:  Level 1 trauma. EXAM: CT CHEST, ABDOMEN, AND PELVIS WITH CONTRAST TECHNIQUE: Multidetector CT imaging of the chest, abdomen and pelvis was performed following the standard protocol during bolus administration of intravenous contrast. RADIATION DOSE REDUCTION: This exam was performed according to the departmental dose-optimization program which includes automated exposure control, adjustment of the mA and/or kV according to patient size and/or use of iterative reconstruction technique. CONTRAST:  75mL OMNIPAQUE IOHEXOL 350 MG/ML SOLN COMPARISON:  None Available. FINDINGS: CT CHEST FINDINGS Cardiovascular: Normal heart size. Aortic atherosclerosis and multi vessel coronary artery calcifications. There is no pericardial effusion. Mediastinum/Nodes: Tracheostomy tube tip is above the carina. There is an enteric tube with tip in the stomach. Thyroid gland, trachea and esophagus are unremarkable. No enlarged mediastinal or hilar lymph nodes. Lungs/Pleura: No significant pleural fluid, signs of pulmonary contusion or pneumothorax. Dependent changes within the posterior lung bases with possible early consolidation or atelectasis in the posterior right base. A few scattered patchy areas of ground-glass attenuation are noted which are nonspecific. No suspicious lung nodule or mass. Musculoskeletal: Nondisplaced fracture is noted involving the anterior aspect of the left 6 rib, image 111/4. There  is very mild superior endplate deformity involving the T11 vertebra  compatible with age indeterminate fracture deformity. There is also a superior endplate compression deformity involving L1 with loss of up to 25% of the vertebral body height. No significant retropulsion of fracture fragments noted. CT ABDOMEN PELVIS FINDINGS Hepatobiliary: No hepatic injury or perihepatic hematoma. Gallbladder is unremarkable. Pancreas: Unremarkable. No pancreatic ductal dilatation or surrounding inflammatory changes. Spleen: Normal in size without focal abnormality. Adrenals/Urinary Tract: No adrenal hemorrhage or renal injury identified. Bladder is unremarkable. Stomach/Bowel: Enteric tube tip is in the gastric fundus. Stomach is nondistended. No pathologic dilatation of the large or small bowel loops. Moderate retained stool noted in the colon. Vascular/Lymphatic: Aortic atherosclerosis. No enlarged abdominal or pelvic lymph nodes. Reproductive: Uterus and bilateral adnexa are unremarkable. Other: No free fluid or fluid collections. No signs of pneumoperitoneum. Musculoskeletal: Increased sclerosis with suspected underlying chronic fracture involving the parasymphyseal left pubic rami, image 63/4 and image 67/6. IMPRESSION: 1. Nondisplaced fracture involving the anterior aspect of the left 6th rib. 2. Age indeterminate superior endplate compression deformities involving T11 and L1. Most severe at L1 with loss of approximately 25% of the vertebral body height. 3. No signs of pulmonary contusion or pneumothorax. 4. No signs of solid organ injury or free fluid in the abdomen or pelvis. 5. Chronic appearing fracture involving the parasymphyseal left pubic rami. 6. Aortic Atherosclerosis (ICD10-I70.0). Critical Value/emergent results were called by telephone at the time of interpretation on 09/04/2022 at 7:59 am to provider [facility] , who verbally acknowledged these results. Electronically Signed   By: Signa Kell M.D.   On: 09/04/2022 08:00   CT Head Wo Contrast  Result Date:  09/04/2022 CLINICAL DATA:  Head trauma EXAM: CT HEAD WITHOUT CONTRAST CT CERVICAL SPINE WITHOUT CONTRAST TECHNIQUE: Multidetector CT imaging of the head and cervical spine was performed following the standard protocol without intravenous contrast. Multiplanar CT image reconstructions of the cervical spine were also generated. RADIATION DOSE REDUCTION: This exam was performed according to the departmental dose-optimization program which includes automated exposure control, adjustment of the mA and/or kV according to patient size and/or use of iterative reconstruction technique. COMPARISON:  None Available. FINDINGS: CT HEAD FINDINGS Brain: Mixed density subdural hematoma from clotted and non clotted blood along the right cerebral convexity measuring up to 17 mm in thickness. Midline shift is pronounced at 18 mm with right uncal herniation. Small volume parenchymal hemorrhage at the subcortical right frontal lobe with adjacent subarachnoid blood. The parenchymal clot measures 12 mm. There is some low-density in the cerebral white matter which is presumably chronic. No compressive infarct is noted. No hydrocephalus currently. Vascular: No hyperdense vessel or unexpected calcification. Skull: Roughly axially oriented calvarial fracture spanning the bilateral lower calvarium, see sagittal reformats, with mastoid involvement on the right to a limited degree causing slight pneumocephalus above the right mastoid air cells. Sinuses/Orbits: No acute finding CT CERVICAL SPINE FINDINGS Alignment: Normal. Skull base and vertebrae: No acute fracture. No primary bone lesion or focal pathologic process. Soft tissues and spinal canal: No prevertebral fluid or swelling. No visible canal hematoma. Disc levels: Degenerative endplate and facet spurring especially advanced at the right C3-4 facet. Upper chest: Reported separately Critical Value/emergent results were called by telephone at the time of interpretation on 09/04/2022 at 7:45 am  to provider Dr. Cliffton Asters who is already aware of the large subdural hematoma IMPRESSION: 1. Large subdural hematoma along the right cerebral convexity with clotted and unclotted blood measuring up to 17  mm in thickness. Midline shift to a similar degree with right uncal herniation. 2. Small volume subarachnoid hemorrhage and parenchymal contusion at the lateral right frontal lobe. 3. Nondepressed calvarial fracture with right mastoid involvement causing trace pneumocephalus. 4. Negative for cervical spine fracture or subluxation. Electronically Signed   By: Tiburcio Pea M.D.   On: 09/04/2022 07:53   CT Cervical Spine Wo Contrast  Result Date: 09/04/2022 CLINICAL DATA:  Head trauma EXAM: CT HEAD WITHOUT CONTRAST CT CERVICAL SPINE WITHOUT CONTRAST TECHNIQUE: Multidetector CT imaging of the head and cervical spine was performed following the standard protocol without intravenous contrast. Multiplanar CT image reconstructions of the cervical spine were also generated. RADIATION DOSE REDUCTION: This exam was performed according to the departmental dose-optimization program which includes automated exposure control, adjustment of the mA and/or kV according to patient size and/or use of iterative reconstruction technique. COMPARISON:  None Available. FINDINGS: CT HEAD FINDINGS Brain: Mixed density subdural hematoma from clotted and non clotted blood along the right cerebral convexity measuring up to 17 mm in thickness. Midline shift is pronounced at 18 mm with right uncal herniation. Small volume parenchymal hemorrhage at the subcortical right frontal lobe with adjacent subarachnoid blood. The parenchymal clot measures 12 mm. There is some low-density in the cerebral white matter which is presumably chronic. No compressive infarct is noted. No hydrocephalus currently. Vascular: No hyperdense vessel or unexpected calcification. Skull: Roughly axially oriented calvarial fracture spanning the bilateral lower calvarium, see  sagittal reformats, with mastoid involvement on the right to a limited degree causing slight pneumocephalus above the right mastoid air cells. Sinuses/Orbits: No acute finding CT CERVICAL SPINE FINDINGS Alignment: Normal. Skull base and vertebrae: No acute fracture. No primary bone lesion or focal pathologic process. Soft tissues and spinal canal: No prevertebral fluid or swelling. No visible canal hematoma. Disc levels: Degenerative endplate and facet spurring especially advanced at the right C3-4 facet. Upper chest: Reported separately Critical Value/emergent results were called by telephone at the time of interpretation on 09/04/2022 at 7:45 am to provider Dr. Cliffton Asters who is already aware of the large subdural hematoma IMPRESSION: 1. Large subdural hematoma along the right cerebral convexity with clotted and unclotted blood measuring up to 17 mm in thickness. Midline shift to a similar degree with right uncal herniation. 2. Small volume subarachnoid hemorrhage and parenchymal contusion at the lateral right frontal lobe. 3. Nondepressed calvarial fracture with right mastoid involvement causing trace pneumocephalus. 4. Negative for cervical spine fracture or subluxation. Electronically Signed   By: Tiburcio Pea M.D.   On: 09/04/2022 07:53    Microbiology: Recent Results (from the past 240 hour(s))  MRSA Next Gen by PCR, Nasal     Status: None   Collection Time: 09/04/22 10:39 AM   Specimen: Nasal Mucosa; Nasal Swab  Result Value Ref Range Status   MRSA by PCR Next Gen NOT DETECTED NOT DETECTED Final    Comment: (NOTE) The GeneXpert MRSA Assay (FDA approved for NASAL specimens only), is one component of a comprehensive MRSA colonization surveillance program. It is not intended to diagnose MRSA infection nor to guide or monitor treatment for MRSA infections. Test performance is not FDA approved in patients less than 84 years old. Performed at [facility], [address] [place],  [place] [address]     Signed: Letha Cape, PA-C 09/20/2022

## 2022-10-02 DEATH — deceased
